# Patient Record
Sex: Male | Born: 1970
Health system: Southern US, Community
[De-identification: ages and names within clinical notes are randomized; demographics above are authoritative.]

## PROBLEM LIST (undated history)

## (undated) ENCOUNTER — Ambulatory Visit: Admission: EM | Payer: Commercial Managed Care - PPO

## (undated) DIAGNOSIS — E785 Hyperlipidemia, unspecified: Secondary | ICD-10-CM

## (undated) HISTORY — PX: ANKLE SURGERY: SHX546

## (undated) HISTORY — PX: LAMINECTOMY: SHX219

---

## 2002-10-13 ENCOUNTER — Encounter: Admission: RE | Admit: 2002-10-13 | Discharge: 2002-10-28 | Payer: Self-pay | Admitting: Neurosurgery

## 2003-05-09 ENCOUNTER — Emergency Department (HOSPITAL_COMMUNITY): Admission: EM | Admit: 2003-05-09 | Discharge: 2003-05-09 | Payer: Self-pay | Admitting: Emergency Medicine

## 2003-05-12 ENCOUNTER — Ambulatory Visit (HOSPITAL_COMMUNITY): Admission: RE | Admit: 2003-05-12 | Discharge: 2003-05-13 | Payer: Self-pay | Admitting: Neurosurgery

## 2009-08-04 ENCOUNTER — Ambulatory Visit: Payer: Self-pay | Admitting: Occupational Medicine

## 2009-08-04 DIAGNOSIS — H6690 Otitis media, unspecified, unspecified ear: Secondary | ICD-10-CM | POA: Insufficient documentation

## 2009-08-04 DIAGNOSIS — J019 Acute sinusitis, unspecified: Secondary | ICD-10-CM | POA: Insufficient documentation

## 2009-11-02 ENCOUNTER — Ambulatory Visit: Payer: Self-pay | Admitting: Family Medicine

## 2009-11-02 DIAGNOSIS — R51 Headache: Secondary | ICD-10-CM | POA: Insufficient documentation

## 2009-11-02 DIAGNOSIS — E785 Hyperlipidemia, unspecified: Secondary | ICD-10-CM | POA: Insufficient documentation

## 2009-11-02 DIAGNOSIS — R509 Fever, unspecified: Secondary | ICD-10-CM | POA: Insufficient documentation

## 2009-11-02 DIAGNOSIS — R519 Headache, unspecified: Secondary | ICD-10-CM | POA: Insufficient documentation

## 2009-11-03 ENCOUNTER — Encounter: Payer: Self-pay | Admitting: Family Medicine

## 2009-11-03 LAB — CONVERTED CEMR LAB
ALT: 57 units/L — ABNORMAL HIGH (ref 0–53)
AST: 30 units/L (ref 0–37)
Albumin: 4.5 g/dL (ref 3.5–5.2)
Alkaline Phosphatase: 55 units/L (ref 39–117)
BUN: 19 mg/dL (ref 6–23)
CO2: 19 meq/L (ref 19–32)
Calcium: 9.1 mg/dL (ref 8.4–10.5)
Chloride: 103 meq/L (ref 96–112)
Creatinine, Ser: 0.94 mg/dL (ref 0.40–1.50)
Glucose, Bld: 94 mg/dL (ref 70–99)
Potassium: 4 meq/L (ref 3.5–5.3)
Sodium: 140 meq/L (ref 135–145)
Total Bilirubin: 0.9 mg/dL (ref 0.3–1.2)
Total Protein: 7.2 g/dL (ref 6.0–8.3)

## 2010-04-11 NOTE — Letter (Signed)
Summary: Out of Work  [facility]  [address]   [place], [place] [address]   Phone: [phone]  Fax: [phone]    November 02, 2009   Employee:  [name] Roker    To Whom It May Concern:   For Medical reasons, please excuse the above named employee from work today and tomorrow.  If you need additional information, please feel free to contact our office.         Sincerely,    Donna Christen MD  Appended Document: Out of Work Urinalysis (dipstick) negative.

## 2010-04-11 NOTE — Assessment & Plan Note (Signed)
Summary: EXTREME HEADACHE AND FEVER (rm 5)   Vital Signs:  Patient Profile:   40 Years Old Male CC:      HA, fever, nausea x 2 days Height:     76 inches O2 Sat:      99 % O2 treatment:    Room Air Temp:     98.7 degrees F oral Pulse rate:   70 / minute Resp:     12 per minute BP sitting:   96 / 66  (left arm) Cuff size:   large  Pt. in pain?   yes    Location:   head    Intensity:   7    Type:       aching  Vitals Entered By: Lajean Saver RN (November 02, 2009 9:48 AM)                   Updated Prior Medication List: ZYRTEC ALLERGY 10 MG CAPS (CETIRIZINE HCL) once daily SUDAFED 12 HOUR 120 MG XR12H-TAB (PSEUDOEPHEDRINE HCL)   Current Allergies (reviewed today): ! * SEASONALHistory of Present Illness Chief Complaint: HA, fever, nausea x 2 days History of Present Illness:  Subjective:  Patient complains of onset of nausea without vomiting five days ago, associated with fever as high as 101. No abdominal pain, GU symptoms, or respiratory symptoms.  Over the past two days he has developed increasing intermittent throbbing occipital headache radiating to the frontal area.  He rarely has headaches but had a similar one in 2005 when he had lumbar disc surgery. He has had no localizing neuro symptoms.  No known tick exposure.  No rash.  He denies arthralgias, although he has had some myalgias.  He has a family history of migraines. He states that his young son developed nausea and fever about 9 to 10 days ago that lasted about two days, and his daughter developed similar symptoms about 5 or 6 days.    REVIEW OF SYSTEMS Constitutional Symptoms       Complains of fever and fatigue.     Denies chills, night sweats, weight loss, and weight gain.      Comments: body aches, jaw and neck soreness Eyes       Denies change in vision, eye pain, eye discharge, glasses, contact lenses, and eye surgery. Ear/Nose/Throat/Mouth       Denies hearing loss/aids, change in hearing, ear pain, ear  discharge, dizziness, frequent runny nose, frequent nose bleeds, sinus problems, sore throat, hoarseness, and tooth pain or bleeding.  Respiratory       Denies dry cough, productive cough, wheezing, shortness of breath, asthma, bronchitis, and emphysema/COPD.  Cardiovascular       Denies murmurs, chest pain, and tires easily with exhertion.    Gastrointestinal       Complains of nausea/vomiting.      Denies stomach pain, diarrhea, constipation, blood in bowel movements, and indigestion.      Comments: no vomiting Genitourniary       Denies painful urination, kidney stones, and loss of urinary control. Neurological       Complains of headaches.      Denies paralysis, seizures, and fainting/blackouts. Musculoskeletal       Complains of joint stiffness.      Denies muscle pain, joint pain, decreased range of motion, redness, swelling, muscle weakness, and gout.  Skin       Denies bruising, unusual mles/lumps or sores, and hair/skin or nail changes.  Psych  Denies mood changes, temper/anger issues, anxiety/stress, speech problems, depression, and sleep problems. Other Comments: Ibuprofen taken this AM. Patient's kids have recently been ill with the same symptoms.    Past History:  Past Medical History:  Hyperlipidemia- diet controlled  Past Surgical History: Reviewed history from 08/04/2009 and no changes required. L5 Laminectomy 05/2003  Family History: Reviewed history from 08/04/2009 and no changes required. none  Social History: Reviewed history from 08/04/2009 and no changes required. Occupation: Science writer Married Never Smoked Alcohol use-no Drug use-no Regular exercise-no   Objective:  Appearance:  Patient appears healthy, stated age, and in no acute distress. Eyes:  Pupils are equal, round, and reactive to light and accomdation.  Extraocular movement is intact.  Conjunctivae are not inflamed.  Ears:  Canals normal.  Tympanic membranes normal.   Nose:   Normal septum.  Normal turbinates, mildly congested.   No sinus tenderness present.  Pharynx:  Normal, moist mucous membranes  Neck:  Supple.  No adenopathy is present.  No thyromegaly is present  Lungs:  Clear to auscultation.  Breath sounds are equal.  Heart:  Regular rate and rhythm without murmurs, rubs, or gallops.  Abdomen:  Nontender without masses or hepatosplenomegaly.  Bowel sounds are present.  No CVA or flank tenderness.  Skin:  No rash Neurologic:  Cranial nerves 2 through 12 are normal.  Patellar and elbow reflexes are normal.  Cerebellar function is intact.  Gait and station are normal.   CBC:  WBC 6.8, Hgb 14.3 Assessment New Problems: FEVER UNSPECIFIED (ICD-780.60) HEADACHE (ICD-784.0) HYPERLIPIDEMIA (ICD-272.4)  NORMAL EXAM TODAY.  SUSPECT VIRAL ILLNESS (GI)  Plan New Medications/Changes: FIORICET 50-325-40 MG TABS (BUTALBITAL-APAP-CAFFEINE) One or two by mouth q4 to 6hr as needed headache.  Max of 6/day  #12 (twelve) x 0, 11/02/2009, Donna Christen MD PROMETHAZINE HCL 25 MG TABS (PROMETHAZINE HCL) 1 by mouth q4 to 6hr as needed nausea  #12 x 0, 11/02/2009, Donna Christen MD  New Orders: CBC w/Diff [[id number]] T-Comprehensive Metabolic Panel [[id number]] Urinalysis [81003-65000] T- * Misc. Laboratory test [phone] T- * Misc. Laboratory test [99999] Est. Patient Level IV [99214] Ketorolac-Toradol 15mg  [J1885] Promethazine up to 50mg  [J2550] Admin of Therapeutic Inj  intramuscular or subcutaneous [96372] Planning Comments:   Toradol 60mg  IM and Phenergan 25mg  IM. Clear liquids today and slowly advance.  Rx for oral phenergan and Fioricet for headache Will check RMSF and Lyme's disease titers.  Also CMP and U/A. Return for worsening symptoms.  Follow-up with PCP if not improving.   The patient and/or caregiver has been counseled thoroughly with regard to medications prescribed including dosage, schedule, interactions, rationale for use, and possible side effects  and they verbalize understanding.  Diagnoses and expected course of recovery discussed and will return if not improved as expected or if the condition worsens. Patient and/or caregiver verbalized understanding.  Prescriptions: FIORICET 50-325-40 MG TABS (BUTALBITAL-APAP-CAFFEINE) One or two by mouth q4 to 6hr as needed headache.  Max of 6/day  #12 (twelve) x 0   Entered and Authorized by:   Donna Christen MD   Signed by:   Donna Christen MD on 11/02/2009   Method used:   Print then Give to Patient   RxID:   [id number] PROMETHAZINE HCL 25 MG TABS (PROMETHAZINE HCL) 1 by mouth q4 to 6hr as needed nausea  #12 x 0   Entered and Authorized by:   Donna Christen MD   Signed by:   Donna Christen MD on 11/02/2009   Method used:  Print then Give to Patient   RxID:   [id number]   Medication Administration  Injection # 1:    Medication: Ketorolac-Toradol 15mg     Diagnosis: HEADACHE (ICD-784.0)    Route: IM    Site: LUOQ gluteus    Exp Date: 11/11/2010    Lot #: [id number]    Mfr: [employer]    Comments: 60mg  given    Patient tolerated injection without complications    Given by: Lajean Saver RN (November 02, 2009 11:37 AM)  Injection # 2:    Medication: Promethazine up to 50mg     Diagnosis: HEADACHE (ICD-784.0)    Route: IM    Site: RUOQ gluteus    Exp Date: 04/11/2010    Lot #: [id number]    Mfr: novaplus    Comments: 25mg  given  Orders Added: 1)  CBC w/Diff [[id number]] 2)  T-Comprehensive Metabolic Panel [[id number]] 3)  Urinalysis [81003-65000] 4)  T- * Misc. Laboratory test [99999] 5)  T- * Misc. Laboratory test [99999] 6)  Est. Patient Level IV [95284] 7)  Ketorolac-Toradol 15mg  [J1885] 8)  Promethazine up to 50mg  [J2550] 9)  Admin of Therapeutic Inj  intramuscular or subcutaneous [96372]  Appended Document: EXTREME HEADACHE AND FEVER (rm 5) UA: Glu: Negative BIL: Negative KET: Trace SG: 1.015 Blood: Negative pH: 6.5 PRO: Negative URO: 0.2 NIT:  Negative LEU: Negative

## 2010-04-11 NOTE — Assessment & Plan Note (Signed)
Summary: SINUS/KH   Vital Signs:  Patient Profile:   40 Years Old Male CC:      bilateral ear pressure, sinus congestion/drainage  Height:     76 inches Weight:      219 pounds O2 Sat:      99 % O2 treatment:    Room Air Temp:     96.9 degrees F oral Pulse rate:   89 / minute Pulse rhythm:   regular Resp:     12 per minute BP sitting:   123 / 74  (right arm) Cuff size:   large  Pt. in pain?   yes    Location:   sinus    Type:       pressure  Vitals Entered By: Lajean Saver RN (Aug 04, 2009 12:08 PM)                   Updated Prior Medication List: ZYRTEC ALLERGY 10 MG CAPS (CETIRIZINE HCL) once daily SUDAFED 12 HOUR 120 MG XR12H-TAB (PSEUDOEPHEDRINE HCL)   Current Allergies: ! * SEASONALHistory of Present Illness Chief Complaint: bilateral ear pressure, sinus congestion/drainage  History of Present Illness: Pleasant gentleman presents with a 2 week history of sinus congestion.   Lost of post nasal drip that keeps him up at night.   Ears have been hurting for about a day.  Throat is a little sore.   Minor cough.   Has been taking zyrtec and pseudofed with some relief.  He has been out of rhinocort for about 6 months.   Has appt with PCP next couple of weeks.   REVIEW OF SYSTEMS Constitutional Symptoms      Denies fever, chills, night sweats, weight loss, weight gain, and fatigue.  Eyes       Denies change in vision, eye pain, eye discharge, glasses, contact lenses, and eye surgery. Ear/Nose/Throat/Mouth       Complains of ear pain, frequent runny nose, and sinus problems.      Denies hearing loss/aids, change in hearing, ear discharge, dizziness, frequent nose bleeds, sore throat, hoarseness, and tooth pain or bleeding.      Comments: bilateral ear pressure Respiratory       Denies dry cough, productive cough, wheezing, shortness of breath, asthma, bronchitis, and emphysema/COPD.  Cardiovascular       Denies murmurs, chest pain, and tires easily with exhertion.     Gastrointestinal       Denies stomach pain, nausea/vomiting, diarrhea, constipation, blood in bowel movements, and indigestion. Genitourniary       Denies painful urination, kidney stones, and loss of urinary control. Neurological       Denies paralysis, seizures, and fainting/blackouts. Musculoskeletal       Denies muscle pain, joint pain, joint stiffness, decreased range of motion, redness, swelling, muscle weakness, and gout.  Skin       Denies bruising, unusual mles/lumps or sores, and hair/skin or nail changes.  Psych       Denies mood changes, temper/anger issues, anxiety/stress, speech problems, depression, and sleep problems. 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 Alcohol use-no Drug use-no Regular exercise-no Smoking Status:  never Drug Use:  no Does Patient Exercise:  no Physical Exam General appearance: well developed, well nourished, no acute distress Ears: inflamed left TM Nasal: swollen  red turbinates with congestion Oral/Pharynx: tongue normal, posterior pharynx without erythema or exudate Neck: neck supple,  trachea midline, no masses Chest/Lungs: no rales, wheezes, or rhonchi bilateral, breath sounds equal without effort Heart: regular rate and  rhythm, no murmur Assessment Problems:   UNSPECIFIED OTITIS MEDIA (ICD-382.9) ACUTE SINUSITIS, UNSPECIFIED (ICD-461.9) New Problems: UNSPECIFIED OTITIS MEDIA (ICD-382.9) ACUTE SINUSITIS, UNSPECIFIED (ICD-461.9)   Plan New Medications/Changes: AZITHROMYCIN 250 MG  TABS (AZITHROMYCIN) 2 by  mouth today and then 1 daily for 4 days  #6 x 0, 08/04/2009, Kathrine Haddock MD RHINOCORT AQUA 32 MCG/ACT SUSP (BUDESONIDE) 2 sprays in each nostril once a day  #1 x 1, 08/04/2009,  Kathrine Haddock MD  New Orders: Est. Patient Level II [phone] Planning Comments:   Refill Rhinocort Z-pack as directed Continue zyrtec and pseudofed follow up wtih PCP at next scheduled appt.   The patient and/or caregiver has been counseled thoroughly with regard to medications prescribed including dosage, schedule, interactions, rationale for use, and possible side effects and they verbalize understanding.  Diagnoses and expected course of recovery discussed and will return if not improved as expected or if the condition worsens. Patient and/or caregiver verbalized understanding.  Prescriptions: AZITHROMYCIN 250 MG  TABS (AZITHROMYCIN) 2 by  mouth today and then 1 daily for 4 days  #6 x 0   Entered and Authorized by:   Kathrine Haddock MD   Signed by:   Kathrine Haddock MD on 08/04/2009   Method used:   Electronically to        [facility]* (retail)       [address]       [address] [place] [address]       [place], [place]  [address]       Ph: [phone]       Fax: [phone]   RxID:   [id number] RHINOCORT AQUA 32 MCG/ACT SUSP (BUDESONIDE) 2 sprays in each nostril once a day  #1 x 1   Entered and Authorized by:   Kathrine Haddock MD   Signed by:   Kathrine Haddock MD on 08/04/2009   Method used:   Electronically to        [facility]* (retail)       [address]       [address] [place] [address]       [place], [place]  [address]       Ph: [phone]       Fax: [phone]   RxID:   [id number]

## 2010-07-28 NOTE — Op Note (Signed)
Joshua Randolph, Joshua Randolph                          ACCOUNT NO.:  [id number]   MEDICAL RECORD NO.:  [id number]                   PATIENT TYPE:  OIB   LOCATION:  3013                                 FACILITY:  [facility]   PHYSICIAN:  Coletta Memos, M.D.                  DATE OF BIRTH:  10-03-1970   DATE OF PROCEDURE:  05/12/2003  DATE OF DISCHARGE:  05/13/2003                                 OPERATIVE REPORT   PREOPERATIVE DIAGNOSIS:  1. Displaced disc right L4-L5.  2. Right L5 radiculopathy.   POSTOPERATIVE DIAGNOSIS:  1. Displaced disc right L4-L5.  2. Right L5 radiculopathy.   PROCEDURE:  Right L4-L5 semi-hemilaminectomy and discectomy with  microdissection.   COMPLICATIONS:  None.   SURGEON:  Coletta Memos, M.D.   ANESTHESIA:  General endotracheal anesthesia.   INDICATIONS FOR PROCEDURE:  Joshua Randolph is a 40 year old whom I have  followed since the summer of 2004.  He had a very small disc at L4-L5 at  that time and some pain which improved with conservative care.  He, however,  called the office this past Monday and said he was in severe pain.  A new  MRI done Tuesday night showed a very large herniated disc at L4-L5.  I,  therefore, recommended and he agreed to undergo operative decompression.   OPERATIVE NOTE:  [name] was brought to the operating room, intubated, and  placed under general anesthesia without difficulty.  He was rolled prone  onto a Wilson frame and all pressure points were properly padded.  His skin  was prepped and he was draped in a sterile fashion.  Using a preoperative  localizing film, I infiltrated 0.5% lidocaine with 1:200,000 epinephrine  epinephrine into the operative field.  I opened the skin with a #10 blade  and took this down to the thoracolumbar fascia sharply.  Then, using  monopolar cautery in a subperiosteal I exposed the lamina of what I believed  to be L4 and L5.  X-rays showed I was in the correct interlaminar space.  Using a microscope  to aid in microdissection, I completed semi-  hemilaminectomy at L4 on the right side removing the ligamentum flavum and  exposing the thecal sac.  After identifying the L5 nerve root, I retracted  that medially and then opened the disc space with a #15 blade after  cauterizing epidural veins.  I removed the disc in a progressive fashion,  decompressing the L5 nerve root in a medial fashion.  I also removed a  significant amount of degenerated disc from the disc space itself.  When I  was satisfied that I had fully decompressed the nerve root after inspecting  the medial, lateral, rostral and caudal direction, I irrigated the wound.  I  then closed the wound in a layered fashion.  The wound dressing was  Dermabond.  The patient tolerated the procedure well, moving all extremities  postop.                                               Coletta Memos, M.D.   KC/MEDQ  D:  05/13/2003  T:  05/13/2003  Job:  [id number]

## 2011-04-27 ENCOUNTER — Emergency Department (INDEPENDENT_AMBULATORY_CARE_PROVIDER_SITE_OTHER)
Admission: EM | Admit: 2011-04-27 | Discharge: 2011-04-27 | Disposition: A | Discharge status: Home / Self Care | Payer: BC Managed Care – PPO | Source: Home / Self Care | Attending: Family Medicine | Admitting: Family Medicine

## 2011-04-27 ENCOUNTER — Encounter: Payer: Self-pay | Admitting: *Deleted

## 2011-04-27 DIAGNOSIS — R0789 Other chest pain: Secondary | ICD-10-CM

## 2011-04-27 HISTORY — DX: Hyperlipidemia, unspecified: E78.5

## 2011-04-27 NOTE — ED Notes (Signed)
Pt c/o LT sided chest tightness and fatigue x today. Denies SOB, denies nausea, and denies LT arm pain.

## 2011-04-28 NOTE — Discharge Instructions (Signed)
If symptoms become significantly worse during the night or over the weekend, proceed to the local emergency room.  

## 2011-04-28 NOTE — ED Provider Notes (Signed)
History     CSN: [id number]  Arrival date & time 04/27/11  1607   First MD Initiated Contact with Patient 04/27/11 1618      Chief Complaint  Patient presents with  . Chest Pain     HPI Comments: Patient complains of onset of vague mild non-radiating left anterior chest discomfort this morning.  The discomfort resolved spontaneously and has not recurred.  No shortness of breath or sweating.  He recalls no recent injury or change in physical activities.  No rash  Patient is a 41 y.o. male presenting with chest pain. The history is provided by the patient.  Chest Pain The chest pain began 1 - 2 hours ago. Duration of episode(s) is 1 hour. The chest pain is resolved. Associated with: nothin. The quality of the pain is described as aching and pressure-like. The pain does not radiate. Exacerbated by: nothing. Pertinent negatives for primary symptoms include no fever, no fatigue, no shortness of breath, no cough, no wheezing, no palpitations, no abdominal pain, no nausea, no vomiting and no dizziness.  Pertinent negatives for associated symptoms include no diaphoresis and no lower extremity edema. He tried nothing for the symptoms. Risk factors: hyperlipidemia.     Past Medical History  Diagnosis Date  . Hyperlipemia     Past Surgical History  Procedure Date  . Ankle surgery   . Laminectomy     History reviewed. No pertinent family history.  History  Substance Use Topics  . Smoking status: Never Smoker   . Smokeless tobacco: Not on file  . Alcohol Use: Yes     rarely      Review of Systems  Constitutional: Negative for fever, diaphoresis and fatigue.  Respiratory: Negative for cough, shortness of breath and wheezing.   Cardiovascular: Positive for chest pain. Negative for palpitations.  Gastrointestinal: Negative for nausea, vomiting and abdominal pain.  Neurological: Negative for dizziness.  All other systems reviewed and are negative.    Allergies  Review of  patient's allergies indicates no known allergies.  Home Medications   Current Outpatient Rx  Name Route Sig Dispense Refill  . ATORVASTATIN CALCIUM 10 MG PO TABS Oral Take 10 mg by mouth daily.      BP 115/77  Pulse 73  Temp(Src) 98.2 F (36.8 C) (Oral)  Resp 16  Ht 6\' 4"  (1.93 m)  Wt 225 lb 8 oz (102.286 kg)  BMI 27.45 kg/m2  SpO2 99%  Physical Exam Nursing notes and Vital Signs reviewed. Appearance:  Patient appears healthy, stated age, and in no acute distress.  He is alert and oriented  Eyes:  Pupils are equal, round, and reactive to light and accomodation.  Extraocular movement is intact.  Conjunctivae are not inflamed  Ears:  Canals normal.  Tympanic membranes normal.  Nose:   No sinus tenderness.  Pharynx:  Normal Neck:  Supple.  No adenopathy Lungs:  Clear to auscultation.  Breath sounds are equal.  Heart:  Regular rate and rhythm without murmurs, rubs, or gallops.  Chest:  Mild tenderness over mid-sternum.  No tenderness over left pectoralis muscle. Abdomen:  Nontender without masses or hepatosplenomegaly.  Bowel sounds are present.  No CVA or flank tenderness.  Extremities:  No edema.  No calf tenderness Skin:  No rash present.   ED Course  Procedures none   EKG:  Within normal limits.  No acute changes   1. Musculoskeletal chest pain       MDM  Recommend follow-up with PCP if  symptoms persist. If symptoms become significantly worse during the night or over the weekend, proceed to the local emergency room.         Donna Christen, MD 04/28/11 1911

## 2012-03-12 DIAGNOSIS — H8101 Meniere's disease, right ear: Secondary | ICD-10-CM

## 2012-03-12 HISTORY — DX: Meniere's disease, right ear: H81.01

## 2015-07-15 DIAGNOSIS — M62830 Muscle spasm of back: Secondary | ICD-10-CM | POA: Diagnosis not present

## 2015-07-15 DIAGNOSIS — M5413 Radiculopathy, cervicothoracic region: Secondary | ICD-10-CM | POA: Diagnosis not present

## 2015-07-15 DIAGNOSIS — M542 Cervicalgia: Secondary | ICD-10-CM | POA: Diagnosis not present

## 2015-07-15 DIAGNOSIS — M546 Pain in thoracic spine: Secondary | ICD-10-CM | POA: Diagnosis not present

## 2015-09-03 ENCOUNTER — Emergency Department
Admission: EM | Admit: 2015-09-03 | Discharge: 2015-09-03 | Disposition: A | Discharge status: Home / Self Care | Payer: 59 | Source: Home / Self Care | Attending: Family Medicine | Admitting: Family Medicine

## 2015-09-03 ENCOUNTER — Encounter: Payer: Self-pay | Admitting: Emergency Medicine

## 2015-09-03 DIAGNOSIS — R300 Dysuria: Secondary | ICD-10-CM | POA: Diagnosis not present

## 2015-09-03 LAB — POCT URINALYSIS DIP (MANUAL ENTRY)
Bilirubin, UA: NEGATIVE
Blood, UA: NEGATIVE
Glucose, UA: NEGATIVE
Ketones, POC UA: NEGATIVE
Leukocytes, UA: NEGATIVE
Nitrite, UA: NEGATIVE
Protein Ur, POC: NEGATIVE
Spec Grav, UA: 1.015 (ref 1.005–1.03)
Urobilinogen, UA: 0.2 (ref 0–1)
pH, UA: 7 (ref 5–8)

## 2015-09-03 MED ORDER — CIPROFLOXACIN HCL 500 MG PO TABS: 500.0000 mg | ORAL_TABLET | Freq: Two times a day (BID) | ORAL | Status: DC

## 2015-09-03 NOTE — ED Provider Notes (Signed)
CSN: [id number]     Arrival date & time 09/03/15  [id number] History   First MD Initiated Contact with Patient 09/03/15 [phone]     Chief Complaint  Patient presents with  . Dysuria  . Urinary Frequency      HPI Comments: Patient noticed mild urinary frequency about two days ago, and yesterday developed mild dysuria and urgency.  No nocturia.  He feels well otherwise.  No testicular pain/swelling.  No urethral discharge. No fevers, chills, and sweats   Patient is a 45 y.o. male presenting with dysuria. The history is provided by the patient.  Dysuria This is a new problem. The current episode started yesterday. The problem occurs constantly. The problem has not changed since onset.Pertinent negatives include no abdominal pain. Nothing aggravates the symptoms. Nothing relieves the symptoms. He has tried nothing for the symptoms.    Past Medical History  Diagnosis Date  . Hyperlipemia    Past Surgical History  Procedure Laterality Date  . Ankle surgery    . Laminectomy     History reviewed. No pertinent family history. Social History  Substance Use Topics  . Smoking status: Never Smoker   . Smokeless tobacco: None  . Alcohol Use: Yes     Comment: rarely    Review of Systems  Gastrointestinal: Negative for abdominal pain.  Genitourinary: Positive for dysuria.  All other systems reviewed and are negative.   Allergies  Review of patient's allergies indicates no known allergies.  Home Medications   Prior to Admission medications   Medication Sig Start Date End Date Taking? Authorizing Provider  atorvastatin (LIPITOR) 10 MG tablet Take 10 mg by mouth daily.    Historical Provider, MD  ciprofloxacin (CIPRO) 500 MG tablet Take 1 tablet (500 mg total) by mouth 2 (two) times daily. 09/03/15   Lattie HawStephen A Beese, MD   Meds Ordered and Administered this Visit  Medications - No data to display  BP 107/62 mmHg  Pulse 66  Temp(Src) 98 F (36.7 C) (Oral)  Resp 16  Ht 6\' 4"  (1.93 m)  Wt  186 lb (84.369 kg)  BMI 22.65 kg/m2  SpO2 97% No data found.   Physical Exam Nursing notes and Vital Signs reviewed. Appearance:  Patient appears stated age, and in no acute distress Eyes:  Pupils are equal, round, and reactive to light and accomodation.  Extraocular movement is intact.  Conjunctivae are not inflamed  Pharynx:  Normal Neck:  Supple.  No adenopathy Lungs:  Clear to auscultation.  Breath sounds are equal.  Moving air well. Heart:  Regular rate and rhythm without murmurs, rubs, or gallops.  Abdomen:  Nontender without masses or hepatosplenomegaly.  Bowel sounds are present.  No CVA or flank tenderness.  Extremities:  No edema.  Skin:  No rash present.   ED Course  Procedures none    Labs Reviewed  URINE CULTURE  POCT URINALYSIS DIP (MANUAL ENTRY) negative      MDM   1. Dysuria    Urine culture pending.  Begin empiric Cipro 500mg  BID #14 Increase fluid intake. Followup with Family Doctor if not improved in one week.     Lattie HawStephen A Beese, MD 09/03/15 1020

## 2015-09-03 NOTE — ED Notes (Signed)
Patient states yesterday began having urgency and dysuria.

## 2015-09-03 NOTE — Discharge Instructions (Signed)
Increase fluid intake.   Dysuria Dysuria is pain or discomfort while urinating. The pain or discomfort may be felt in the tube that carries urine out of the bladder (urethra) or in the surrounding tissue of the genitals. The pain may also be felt in the groin area, lower abdomen, and lower back. You may have to urinate frequently or have the sudden feeling that you have to urinate (urgency). Dysuria can affect both men and women, but is more common in women. Dysuria can be caused by many different things, including:  Urinary tract infection in women.  Infection of the kidney or bladder.  Kidney stones or bladder stones.  Certain sexually transmitted infections (STIs), such as chlamydia.  Dehydration.  Inflammation of the vagina.  Use of certain medicines.  Use of certain soaps or scented products that cause irritation. HOME CARE INSTRUCTIONS Watch your dysuria for any changes. The following actions may help to reduce any discomfort you are feeling:  Drink enough fluid to keep your urine clear or pale yellow.  Empty your bladder often. Avoid holding urine for long periods of time.  After a bowel movement or urination, women should cleanse from front to back, using each tissue only once.  Empty your bladder after sexual intercourse.  Take medicines only as directed by your health care provider.  If you were prescribed an antibiotic medicine, finish it all even if you start to feel better.  Avoid caffeine, tea, and alcohol. They can irritate the bladder and make dysuria worse. In men, alcohol may irritate the prostate.  Keep all follow-up visits as directed by your health care provider. This is important.  If you had any tests done to find the cause of dysuria, it is your responsibility to obtain your test results. Ask the lab or department performing the test when and how you will get your results. Talk with your health care provider if you have any questions about your  results. SEEK MEDICAL CARE IF:  You develop pain in your back or sides.  You have a fever.  You have nausea or vomiting.  You have blood in your urine.  You are not urinating as often as you usually do. SEEK IMMEDIATE MEDICAL CARE IF:  You pain is severe and not relieved with medicines.  You are unable to hold down any fluids.  You or someone else notices a change in your mental function.  You have a rapid heartbeat at rest.  You have shaking or chills.  You feel extremely weak.   This information is not intended to replace advice given to you by your health care provider. Make sure you discuss any questions you have with your health care provider.   Document Released: 11/25/2003 Document Revised: 03/19/2014 Document Reviewed: 10/22/2013 Elsevier Interactive Patient Education [employer]2016 Elsevier Inc.

## 2015-09-04 LAB — URINE CULTURE
Colony Count: NO GROWTH
Organism ID, Bacteria: NO GROWTH

## 2015-09-05 ENCOUNTER — Telehealth: Payer: Self-pay | Admitting: *Deleted

## 2015-09-05 NOTE — ED Notes (Unsigned)
LM with Ucx results and to call back if he has any questions or concerns. Clemens Catholichristy Kalan Yeley, LPN

## 2015-09-07 ENCOUNTER — Emergency Department (HOSPITAL_BASED_OUTPATIENT_CLINIC_OR_DEPARTMENT_OTHER)
Admission: EM | Admit: 2015-09-07 | Discharge: 2015-09-07 | Disposition: A | Discharge status: Home / Self Care | Payer: 59 | Attending: Emergency Medicine | Admitting: Emergency Medicine

## 2015-09-07 ENCOUNTER — Emergency Department (HOSPITAL_BASED_OUTPATIENT_CLINIC_OR_DEPARTMENT_OTHER): Payer: 59

## 2015-09-07 ENCOUNTER — Encounter (HOSPITAL_BASED_OUTPATIENT_CLINIC_OR_DEPARTMENT_OTHER): Payer: Self-pay | Admitting: Emergency Medicine

## 2015-09-07 DIAGNOSIS — N200 Calculus of kidney: Secondary | ICD-10-CM | POA: Insufficient documentation

## 2015-09-07 DIAGNOSIS — R109 Unspecified abdominal pain: Secondary | ICD-10-CM | POA: Diagnosis present

## 2015-09-07 DIAGNOSIS — N1330 Unspecified hydronephrosis: Secondary | ICD-10-CM | POA: Diagnosis not present

## 2015-09-07 DIAGNOSIS — E785 Hyperlipidemia, unspecified: Secondary | ICD-10-CM | POA: Insufficient documentation

## 2015-09-07 LAB — CBC WITH DIFFERENTIAL/PLATELET
Basophils Absolute: 0.1 10*3/uL (ref 0.0–0.1)
Basophils Relative: 1 %
Eosinophils Absolute: 0.1 10*3/uL (ref 0.0–0.7)
Eosinophils Relative: 1 %
HCT: 43.5 % (ref 39.0–52.0)
Hemoglobin: 15.1 g/dL (ref 13.0–17.0)
Lymphocytes Relative: 13 %
Lymphs Abs: 1.4 10*3/uL (ref 0.7–4.0)
MCH: 31.9 pg (ref 26.0–34.0)
MCHC: 34.7 g/dL (ref 30.0–36.0)
MCV: 92 fL (ref 78.0–100.0)
Monocytes Absolute: 0.9 10*3/uL (ref 0.1–1.0)
Monocytes Relative: 8 %
Neutro Abs: 8.6 10*3/uL — ABNORMAL HIGH (ref 1.7–7.7)
Neutrophils Relative %: 77 %
Platelets: 217 10*3/uL (ref 150–400)
RBC: 4.73 MIL/uL (ref 4.22–5.81)
RDW: 12.6 % (ref 11.5–15.5)
WBC: 11 10*3/uL — ABNORMAL HIGH (ref 4.0–10.5)

## 2015-09-07 LAB — URINALYSIS, ROUTINE W REFLEX MICROSCOPIC
Bilirubin Urine: NEGATIVE
Glucose, UA: NEGATIVE mg/dL
Ketones, ur: NEGATIVE mg/dL
Leukocytes, UA: NEGATIVE
Nitrite: NEGATIVE
Protein, ur: >300 mg/dL — AB
Specific Gravity, Urine: 1.024 (ref 1.005–1.030)
pH: 6 (ref 5.0–8.0)

## 2015-09-07 LAB — BASIC METABOLIC PANEL
Anion gap: 8 (ref 5–15)
BUN: 24 mg/dL — ABNORMAL HIGH (ref 6–20)
CO2: 27 mmol/L (ref 22–32)
Calcium: 9.2 mg/dL (ref 8.9–10.3)
Chloride: 102 mmol/L (ref 101–111)
Creatinine, Ser: 1.22 mg/dL (ref 0.61–1.24)
GFR calc Af Amer: >60 mL/min (ref >60)
GFR calc non Af Amer: >60 mL/min (ref >60)
Glucose, Bld: 115 mg/dL — ABNORMAL HIGH (ref 65–99)
Potassium: 3.6 mmol/L (ref 3.5–5.1)
Sodium: 137 mmol/L (ref 135–145)

## 2015-09-07 LAB — URINE MICROSCOPIC-ADD ON: Bacteria, UA: NONE SEEN

## 2015-09-07 MED ORDER — ONDANSETRON HCL 4 MG/2ML IJ SOLN
4.0000 mg | Freq: Once | INTRAMUSCULAR | Status: AC
  Administered 2015-09-07: 4 mg via INTRAVENOUS
  Filled 2015-09-07: qty 2

## 2015-09-07 MED ORDER — FENTANYL CITRATE (PF) 100 MCG/2ML IJ SOLN
50.0000 ug | Freq: Once | INTRAMUSCULAR | Status: AC
  Administered 2015-09-07: 50 ug via INTRAVENOUS
  Filled 2015-09-07: qty 2

## 2015-09-07 MED ORDER — ONDANSETRON 4 MG PO TBDP: 4.0000 mg | ORAL_TABLET | Freq: Three times a day (TID) | ORAL | Status: DC | PRN

## 2015-09-07 MED ORDER — MORPHINE SULFATE (PF) 4 MG/ML IV SOLN
4.0000 mg | Freq: Once | INTRAVENOUS | Status: AC
  Administered 2015-09-07: 4 mg via INTRAVENOUS
  Filled 2015-09-07: qty 1

## 2015-09-07 MED ORDER — OXYCODONE-ACETAMINOPHEN 5-325 MG PO TABS: 1.0000 | ORAL_TABLET | Freq: Four times a day (QID) | ORAL | Status: DC | PRN

## 2015-09-07 MED ORDER — SODIUM CHLORIDE 0.9 % IV BOLUS (SEPSIS)
1000.0000 mL | Freq: Once | INTRAVENOUS | Status: AC
  Administered 2015-09-07: 1000 mL via INTRAVENOUS

## 2015-09-07 MED ORDER — IOPAMIDOL (ISOVUE-300) INJECTION 61%
100.0000 mL | Freq: Once | INTRAVENOUS | Status: AC | PRN
  Administered 2015-09-07: 100 mL via INTRAVENOUS

## 2015-09-07 MED ORDER — KETOROLAC TROMETHAMINE 30 MG/ML IJ SOLN
30.0000 mg | Freq: Once | INTRAMUSCULAR | Status: AC
  Administered 2015-09-07: 30 mg via INTRAVENOUS
  Filled 2015-09-07: qty 1

## 2015-09-07 MED FILL — OXYCODONE/APAP 5-325: 5-325 | 2 days supply | Qty: 10 | Fill #0

## 2015-09-07 NOTE — ED Provider Notes (Signed)
CSN: [id number]     Arrival date & time 09/07/15  0010 History   First MD Initiated Contact with Patient 09/07/15 0059     Chief Complaint  Patient presents with  . Abdominal Pain     (Consider location/radiation/quality/duration/timing/severity/associated sxs/prior Treatment) HPI  This is a 45 year old male with no significant past medical history who presents with abdominal pain and dysuria. Patient was seen and evaluated this weekend for the same. Reportedly had a negative urinalysis but was prescribed Cipro for his symptoms. Patient thought he was improving but the pain worsened tonight. He states the pain is periumbilical and in the right lower quadrant. He also has right flank pain. Pain comes and goes. It has been constant since 9 PM. He reports burning with urination. Denies hematuria. Pain is currently 7 out of 10. No fevers but does report night sweats on Saturday night. He reports nausea without vomiting.  Past Medical History  Diagnosis Date  . Hyperlipemia    Past Surgical History  Procedure Laterality Date  . Ankle surgery    . Laminectomy     No family history on file. Social History  Substance Use Topics  . Smoking status: Never Smoker   . Smokeless tobacco: None  . Alcohol Use: Yes     Comment: rarely    Review of Systems  Constitutional: Negative for fever.  Respiratory: Negative for shortness of breath.   Cardiovascular: Negative for chest pain.  Gastrointestinal: Positive for nausea and abdominal pain. Negative for vomiting and diarrhea.  Genitourinary: Positive for dysuria. Negative for flank pain.  Musculoskeletal: Negative for back pain.  All other systems reviewed and are negative.     Allergies  Review of patient's allergies indicates no known allergies.  Home Medications   Prior to Admission medications   Medication Sig Start Date End Date Taking? Authorizing Provider  atorvastatin (LIPITOR) 10 MG tablet Take 10 mg by mouth daily.     Historical Provider, MD  ciprofloxacin (CIPRO) 500 MG tablet Take 1 tablet (500 mg total) by mouth 2 (two) times daily. 09/03/15   Lattie HawStephen A Beese, MD   BP 122/66 mmHg  Pulse 64  Temp(Src) 97.9 F (36.6 C)  Resp 18  Ht 6\' 4"  (1.93 m)  Wt 187 lb (84.823 kg)  BMI 22.77 kg/m2  SpO2 98% Physical Exam  Constitutional: He is oriented to person, place, and time. He appears well-developed and well-nourished. No distress.  HENT:  Head: Normocephalic and atraumatic.  Cardiovascular: Normal rate, regular rhythm and normal heart sounds.   No murmur heard. Pulmonary/Chest: Effort normal and breath sounds normal. No respiratory distress. He has no wheezes.  Abdominal: Soft. Bowel sounds are normal. There is tenderness. There is no rebound.  Right lower quadrant tenderness palpation without rebound or guarding  Genitourinary:  No CVA tenderness  Musculoskeletal: He exhibits no edema.  Neurological: He is alert and oriented to person, place, and time.  Skin: Skin is warm and dry.  Psychiatric: He has a normal mood and affect.  Nursing note and vitals reviewed.   ED Course  Procedures (including critical care time) Labs Review Labs Reviewed  CBC WITH DIFFERENTIAL/PLATELET - Abnormal; Notable for the following:    WBC 11.0 (*)    Neutro Abs 8.6 (*)    All other components within normal limits  BASIC METABOLIC PANEL - Abnormal; Notable for the following:    Glucose, Bld 115 (*)    BUN 24 (*)    All other components within normal  limits  URINALYSIS, ROUTINE W REFLEX MICROSCOPIC (NOT AT [facility]) - Abnormal; Notable for the following:    Hgb urine dipstick SMALL (*)    Protein, ur >300 (*)    All other components within normal limits  URINE MICROSCOPIC-ADD ON - Abnormal; Notable for the following:    Squamous Epithelial / LPF 0-5 (*)    Casts HYALINE CASTS (*)    All other components within normal limits    Imaging Review Ct Abdomen Pelvis W Contrast  09/07/2015  CLINICAL DATA:   45 year old male with right lower quadrant abdominal pain EXAM: CT ABDOMEN AND PELVIS WITH CONTRAST TECHNIQUE: Multidetector CT imaging of the abdomen and pelvis was performed using the standard protocol following bolus administration of intravenous contrast. CONTRAST:  100mL ISOVUE-300 IOPAMIDOL (ISOVUE-300) INJECTION 61% COMPARISON:  CT dated 11/23/2011 FINDINGS: The visualized lung bases are clear. No intra-abdominal free air. Small free fluid within the pelvis. The liver, gallbladder, pancreas, spleen, adrenal glands, left kidney, left ureter appear unremarkable. There is a 2 mm calculus along the posterior wall of the urinary bladder adjacent to the right UVJ which may represent a recently passed right renal calculus versus right UVJ stone. There is mild right hydronephrosis. There is diffuse haziness of the bladder wall as well as haziness of the right perinephric fat. Correlation with urinalysis recommended to evaluate for superimposed UTI. The prostate and seminal vesicles are grossly unremarkable. There are scattered sigmoid diverticula without active inflammatory changes. There is moderate stool throughout the colon. Multiple normal caliber fecalized loops of small bowel noted suggestive of chronic stasis. There is no evidence of bowel obstruction or active inflammation. Normal appendix. The abdominal aorta and IVC appear unremarkable. No portal venous gas identified. There is no adenopathy. The abdominal wall soft tissues appear unremarkable. The osseous structures are intact. IMPRESSION: A 2 mm recently passed right renal calculus versus a right UPJ stone with mild right hydronephrosis. Correlation with urinalysis recommended to exclude superimposed UTI. Constipation with chronic fecal stasis within the small bowel. No bowel obstruction or active inflammation. Normal appendix. Electronically Signed   By: Elgie CollardArash  Radparvar M.D.   On: 09/07/2015 03:36   I have personally reviewed and evaluated these  images and lab results as part of my medical decision-making.   EKG Interpretation None      MDM   Final diagnoses:  Kidney stone   Patient presents with right flank, right lower quadrant pain and urinary symptoms. Currently on ciprofloxacin. Nontoxic-appearing. Does have some tenderness to palpation of the right lower quadrant. No signs of peritonitis. Considerations include appendicitis, pyelonephritis. Kidney stone is also consideration; however, would not expect tenderness on exam. Patient was given pain and nausea medication. He was also given fluids. Lab work obtained and notable for mild leukocytosis to 11. No leukocytes in the urine. CT obtained to evaluate for appendicitis, pyelonephritis, kidney stones. CT shows a 2 mm stone recently passed into the bladder. He has some hydronephrosis on that side. This is likely the cause of patient's symptoms. No evidence of UTI at this time but the patient is currently on Cipro. On recheck, he reports improvement of his pain. Patient to continue Cipro and will be provided with medications for supportive care. Urology follow-up.  After history, exam, and medical workup I feel the patient has been appropriately medically screened and is safe for discharge home. Pertinent diagnoses were discussed with the patient. Patient was given return precautions.     Shon Batonourtney F Horton, MD 09/07/15 0430

## 2015-09-07 NOTE — ED Notes (Signed)
C/o abd discomfort and burning w urination since sat,  Tonight abd pain is worse,  Was seen sat for same  Started on cipro but no improvement

## 2015-09-07 NOTE — ED Notes (Signed)
RLQ pain with slight pain in right flank.  [facility]  UA and culture were clear.  Is on day 4 of Cipro.  Pain much worse tonight.

## 2015-09-07 NOTE — Discharge Instructions (Signed)
You were found to have passed a kidney stone anterior bladder. Hopefully your pain will improve given that you pass the stone. If you have recurrent pain or any new or worsening symptoms she needs to follow-up with urology. Continue ciprofloxacin as previously directed.  Kidney Stones Kidney stones (urolithiasis) are deposits that form inside your kidneys. The intense pain is caused by the stone moving through the urinary tract. When the stone moves, the ureter goes into spasm around the stone. The stone is usually passed in the urine.  CAUSES   A disorder that makes certain neck glands produce too much parathyroid hormone (primary hyperparathyroidism).  A buildup of uric acid crystals, similar to gout in your joints.  Narrowing (stricture) of the ureter.  A kidney obstruction present at birth (congenital obstruction).  Previous surgery on the kidney or ureters.  Numerous kidney infections. SYMPTOMS   Feeling sick to your stomach (nauseous).  Throwing up (vomiting).  Blood in the urine (hematuria).  Pain that usually spreads (radiates) to the groin.  Frequency or urgency of urination. DIAGNOSIS   Taking a history and physical exam.  Blood or urine tests.  CT scan.  Occasionally, an examination of the inside of the urinary bladder (cystoscopy) is performed. TREATMENT   Observation.  Increasing your fluid intake.  Extracorporeal shock wave lithotripsy--This is a noninvasive procedure that uses shock waves to break up kidney stones.  Surgery may be needed if you have severe pain or persistent obstruction. There are various surgical procedures. Most of the procedures are performed with the use of small instruments. Only small incisions are needed to accommodate these instruments, so recovery time is minimized. The size, location, and chemical composition are all important variables that will determine the proper choice of action for you. Talk to your health care provider to  better understand your situation so that you will minimize the risk of injury to yourself and your kidney.  HOME CARE INSTRUCTIONS   Drink enough water and fluids to keep your urine clear or pale yellow. This will help you to pass the stone or stone fragments.  Strain all urine through the provided strainer. Keep all particulate matter and stones for your health care provider to see. The stone causing the pain may be as small as a grain of salt. It is very important to use the strainer each and every time you pass your urine. The collection of your stone will allow your health care provider to analyze it and verify that a stone has actually passed. The stone analysis will often identify what you can do to reduce the incidence of recurrences.  Only take over-the-counter or prescription medicines for pain, discomfort, or fever as directed by your health care provider.  Keep all follow-up visits as told by your health care provider. This is important.  Get follow-up X-rays if required. The absence of pain does not always mean that the stone has passed. It may have only stopped moving. If the urine remains completely obstructed, it can cause loss of kidney function or even complete destruction of the kidney. It is your responsibility to make sure X-rays and follow-ups are completed. Ultrasounds of the kidney can show blockages and the status of the kidney. Ultrasounds are not associated with any radiation and can be performed easily in a matter of minutes.  Make changes to your daily diet as told by your health care provider. You may be told to:  Limit the amount of salt that you eat.  Eat  5 or more servings of fruits and vegetables each day.  Limit the amount of meat, poultry, fish, and eggs that you eat.  Collect a 24-hour urine sample as told by your health care provider.You may need to collect another urine sample every 6-12 months. SEEK MEDICAL CARE IF:  You experience pain that is  progressive and unresponsive to any pain medicine you have been prescribed. SEEK IMMEDIATE MEDICAL CARE IF:   Pain cannot be controlled with the prescribed medicine.  You have a fever or shaking chills.  The severity or intensity of pain increases over 18 hours and is not relieved by pain medicine.  You develop a new onset of abdominal pain.  You feel faint or pass out.  You are unable to urinate.   This information is not intended to replace advice given to you by your health care provider. Make sure you discuss any questions you have with your health care provider.   Document Released: 02/26/2005 Document Revised: 11/17/2014 Document Reviewed: 07/30/2012 Elsevier Interactive Patient Education [employer] Inc[employer]

## 2015-09-21 DIAGNOSIS — M546 Pain in thoracic spine: Secondary | ICD-10-CM | POA: Diagnosis not present

## 2015-09-21 DIAGNOSIS — M542 Cervicalgia: Secondary | ICD-10-CM | POA: Diagnosis not present

## 2015-09-21 DIAGNOSIS — M62830 Muscle spasm of back: Secondary | ICD-10-CM | POA: Diagnosis not present

## 2015-09-21 DIAGNOSIS — M5413 Radiculopathy, cervicothoracic region: Secondary | ICD-10-CM | POA: Diagnosis not present

## 2015-11-03 DIAGNOSIS — F4321 Adjustment disorder with depressed mood: Secondary | ICD-10-CM | POA: Diagnosis not present

## 2015-11-29 DIAGNOSIS — F4321 Adjustment disorder with depressed mood: Secondary | ICD-10-CM | POA: Diagnosis not present

## 2015-12-08 DIAGNOSIS — F4321 Adjustment disorder with depressed mood: Secondary | ICD-10-CM | POA: Diagnosis not present

## 2015-12-29 DIAGNOSIS — F4321 Adjustment disorder with depressed mood: Secondary | ICD-10-CM | POA: Diagnosis not present

## 2016-01-19 DIAGNOSIS — F4321 Adjustment disorder with depressed mood: Secondary | ICD-10-CM | POA: Diagnosis not present

## 2016-02-14 DIAGNOSIS — F4321 Adjustment disorder with depressed mood: Secondary | ICD-10-CM | POA: Diagnosis not present

## 2016-02-24 DIAGNOSIS — I809 Phlebitis and thrombophlebitis of unspecified site: Secondary | ICD-10-CM | POA: Diagnosis not present

## 2016-02-24 MED FILL — IBUPROFEN 800 MG TABLET: 800 | 23 days supply | Qty: 90 | Fill #0

## 2016-02-28 DIAGNOSIS — F4321 Adjustment disorder with depressed mood: Secondary | ICD-10-CM | POA: Diagnosis not present

## 2016-03-08 DIAGNOSIS — F4321 Adjustment disorder with depressed mood: Secondary | ICD-10-CM | POA: Diagnosis not present

## 2016-03-26 DIAGNOSIS — F4321 Adjustment disorder with depressed mood: Secondary | ICD-10-CM | POA: Diagnosis not present

## 2016-05-08 DIAGNOSIS — F4321 Adjustment disorder with depressed mood: Secondary | ICD-10-CM | POA: Diagnosis not present

## 2016-05-12 DIAGNOSIS — M546 Pain in thoracic spine: Secondary | ICD-10-CM | POA: Diagnosis not present

## 2016-05-12 DIAGNOSIS — M62830 Muscle spasm of back: Secondary | ICD-10-CM | POA: Diagnosis not present

## 2016-05-12 DIAGNOSIS — M545 Low back pain: Secondary | ICD-10-CM | POA: Diagnosis not present

## 2016-05-12 DIAGNOSIS — M542 Cervicalgia: Secondary | ICD-10-CM | POA: Diagnosis not present

## 2016-05-17 DIAGNOSIS — F4320 Adjustment disorder, unspecified: Secondary | ICD-10-CM | POA: Diagnosis not present

## 2016-05-24 DIAGNOSIS — F4320 Adjustment disorder, unspecified: Secondary | ICD-10-CM | POA: Diagnosis not present

## 2016-05-31 DIAGNOSIS — F4320 Adjustment disorder, unspecified: Secondary | ICD-10-CM | POA: Diagnosis not present

## 2016-06-01 DIAGNOSIS — M62830 Muscle spasm of back: Secondary | ICD-10-CM | POA: Diagnosis not present

## 2016-06-01 DIAGNOSIS — M546 Pain in thoracic spine: Secondary | ICD-10-CM | POA: Diagnosis not present

## 2016-06-01 DIAGNOSIS — M545 Low back pain: Secondary | ICD-10-CM | POA: Diagnosis not present

## 2016-06-01 DIAGNOSIS — M542 Cervicalgia: Secondary | ICD-10-CM | POA: Diagnosis not present

## 2016-06-06 DIAGNOSIS — M546 Pain in thoracic spine: Secondary | ICD-10-CM | POA: Diagnosis not present

## 2016-06-06 DIAGNOSIS — M542 Cervicalgia: Secondary | ICD-10-CM | POA: Diagnosis not present

## 2016-06-06 DIAGNOSIS — M545 Low back pain: Secondary | ICD-10-CM | POA: Diagnosis not present

## 2016-06-06 DIAGNOSIS — M62830 Muscle spasm of back: Secondary | ICD-10-CM | POA: Diagnosis not present

## 2016-06-13 DIAGNOSIS — F4320 Adjustment disorder, unspecified: Secondary | ICD-10-CM | POA: Diagnosis not present

## 2016-06-14 DIAGNOSIS — F4320 Adjustment disorder, unspecified: Secondary | ICD-10-CM | POA: Diagnosis not present

## 2016-07-03 DIAGNOSIS — F4320 Adjustment disorder, unspecified: Secondary | ICD-10-CM | POA: Diagnosis not present

## 2016-08-27 DIAGNOSIS — F4321 Adjustment disorder with depressed mood: Secondary | ICD-10-CM | POA: Diagnosis not present

## 2016-09-27 DIAGNOSIS — F4321 Adjustment disorder with depressed mood: Secondary | ICD-10-CM | POA: Diagnosis not present

## 2016-10-17 DIAGNOSIS — F4321 Adjustment disorder with depressed mood: Secondary | ICD-10-CM | POA: Diagnosis not present

## 2016-12-21 DIAGNOSIS — M6289 Other specified disorders of muscle: Secondary | ICD-10-CM | POA: Diagnosis not present

## 2016-12-21 DIAGNOSIS — R5383 Other fatigue: Secondary | ICD-10-CM | POA: Diagnosis not present

## 2016-12-22 DIAGNOSIS — R739 Hyperglycemia, unspecified: Secondary | ICD-10-CM | POA: Diagnosis not present

## 2016-12-22 DIAGNOSIS — R5383 Other fatigue: Secondary | ICD-10-CM | POA: Diagnosis not present

## 2016-12-22 DIAGNOSIS — M6289 Other specified disorders of muscle: Secondary | ICD-10-CM | POA: Diagnosis not present

## 2017-05-03 DIAGNOSIS — F4320 Adjustment disorder, unspecified: Secondary | ICD-10-CM | POA: Diagnosis not present

## 2017-05-28 DIAGNOSIS — F4320 Adjustment disorder, unspecified: Secondary | ICD-10-CM | POA: Diagnosis not present

## 2017-06-29 ENCOUNTER — Telehealth: Payer: 59 | Admitting: Physician Assistant

## 2017-06-29 DIAGNOSIS — H01001 Unspecified blepharitis right upper eyelid: Secondary | ICD-10-CM | POA: Diagnosis not present

## 2017-06-29 MED ORDER — POLYMYXIN B-TRIMETHOPRIM 10000-0.1 UNIT/ML-% OP SOLN: OPHTHALMIC | 0 refills | Status: DC

## 2017-06-29 NOTE — Progress Notes (Signed)
We are sorry that you are not feeling well.  Here is how we plan to help!  Based on what you have shared with me it looks like you have blepharitis. Blepharitis is a common inflammatory or infectious condition of the eyelid. In most cases it is bacterial.   I have prescribed Polytrim Ophthalmic drops 1-2 drops 4 times a day times 5 days    Strict handwashing is suggested with soap and water is urged.  If not available, use alcohol based had sanitizer.  Avoid unnecessary touching of the eye.  If you wear contact lenses, you will need to refrain from wearing them until you see no white discharge from the eye for at least 24 hours after being on medication.  You should see symptom improvement in 1-2 days after starting the medication regimen.  Call us if symptoms are not improved in 1-2 days.  Home Care:  Wash your hands often!  Do not wear your contacts until you complete your treatment plan.  Avoid sharing towels, bed linen, personal items with a person who has pink eye.  See attention for anyone in your home with similar symptoms.  Get Help Right Away If:  Your symptoms do not improve.  You develop blurred or loss of vision.  Your symptoms worsen (increased discharge, pain or redness)  Your e-visit answers were reviewed by a board certified advanced clinical practitioner to complete your personal care plan.  Depending on the condition, your plan could have included both over the counter or prescription medications.  If there is a problem please reply  once you have received a response from your provider.  Your safety is important to us.  If you have drug allergies check your prescription carefully.    You can use MyChart to ask questions about today's visit, request a non-urgent call back, or ask for a work or school excuse for 24 hours related to this e-Visit. If it has been greater than 24 hours you will need to follow up with your provider, or enter a new e-Visit to address those  concerns.   You will get an e-mail in the next two days asking about your experience.  I hope that your e-visit has been valuable and will speed your recovery. Thank you for using e-visits.

## 2017-08-19 ENCOUNTER — Ambulatory Visit (INDEPENDENT_AMBULATORY_CARE_PROVIDER_SITE_OTHER): Payer: Self-pay | Admitting: Nurse Practitioner

## 2017-08-19 VITALS — BP 118/76 | HR 66 | Temp 98.5°F | Resp 20 | Ht 75.0 in | Wt 213.0 lb

## 2017-08-19 DIAGNOSIS — Z0000 Encounter for general adult medical examination without abnormal findings: Secondary | ICD-10-CM

## 2017-08-19 NOTE — Patient Instructions (Signed)
Preventive Care 40-64 Years, Male Preventive care refers to lifestyle choices and visits with your health care provider that can promote health and wellness. What does preventive care include?  A yearly physical exam. This is also called an annual well check.  Dental exams once or twice a year.  Routine eye exams. Ask your health care provider how often you should have your eyes checked.  Personal lifestyle choices, including: ? Daily care of your teeth and gums. ? Regular physical activity. ? Eating a healthy diet. ? Avoiding tobacco and drug use. ? Limiting alcohol use. ? Practicing safe sex. ? Taking low-dose aspirin every day starting at age 45. What happens during an annual well check? The services and screenings done by your health care provider during your annual well check will depend on your age, overall health, lifestyle risk factors, and family history of disease. Counseling Your health care provider may ask you questions about your:  Alcohol use.  Tobacco use.  Drug use.  Emotional well-being.  Home and relationship well-being.  Sexual activity.  Eating habits.  Work and work Statistician.  Screening You may have the following tests or measurements:  Height, weight, and BMI.  Blood pressure.  Lipid and cholesterol levels. These may be checked every 5 years, or more frequently if you are over 76 years old.  Skin check.  Lung cancer screening. You may have this screening every year starting at age 45 if you have a 30-pack-year history of smoking and currently smoke or have quit within the past 15 years.  Fecal occult blood test (FOBT) of the stool. You may have this test every year starting at age 45.  Flexible sigmoidoscopy or colonoscopy. You may have a sigmoidoscopy every 5 years or a colonoscopy every 10 years starting at age 45.  Prostate cancer screening. Recommendations will vary depending on your family history and other risks.  Hepatitis C  blood test.  Hepatitis B blood test.  Sexually transmitted disease (STD) testing.  Diabetes screening. This is done by checking your blood sugar (glucose) after you have not eaten for a while (fasting). You may have this done every 1-3 years.  Discuss your test results, treatment options, and if necessary, the need for more tests with your health care provider. Vaccines Your health care provider may recommend certain vaccines, such as:  Influenza vaccine. This is recommended every year.  Tetanus, diphtheria, and acellular pertussis (Tdap, Td) vaccine. You may need a Td booster every 10 years.  Varicella vaccine. You may need this if you have not been vaccinated.  Zoster vaccine. You may need this after age 45.  Measles, mumps, and rubella (MMR) vaccine. You may need at least one dose of MMR if you were born in 1957 or later. You may also need a second dose.  Pneumococcal 13-valent conjugate (PCV13) vaccine. You may need this if you have certain conditions and have not been vaccinated.  Pneumococcal polysaccharide (PPSV23) vaccine. You may need one or two doses if you smoke cigarettes or if you have certain conditions.  Meningococcal vaccine. You may need this if you have certain conditions.  Hepatitis A vaccine. You may need this if you have certain conditions or if you travel or work in places where you may be exposed to hepatitis A.  Hepatitis B vaccine. You may need this if you have certain conditions or if you travel or work in places where you may be exposed to hepatitis B.  Haemophilus influenzae type b (Hib) vaccine.  You may need this if you have certain risk factors.  Talk to your health care provider about which screenings and vaccines you need and how often you need them. This information is not intended to replace advice given to you by your health care provider. Make sure you discuss any questions you have with your health care provider. Document Released: 03/25/2015  Document Revised: 11/16/2015 Document Reviewed: 12/28/2014 Elsevier Interactive Patient Education  2018 Elsevier Inc.  Health Maintenance, Male A healthy lifestyle and preventive care is important for your health and wellness. Ask your health care provider about what schedule of regular examinations is right for you. What should I know about weight and diet? Eat a Healthy Diet  Eat plenty of vegetables, fruits, whole grains, low-fat dairy products, and lean protein.  Do not eat a lot of foods high in solid fats, added sugars, or salt.  Maintain a Healthy Weight Regular exercise can help you achieve or maintain a healthy weight. You should:  Do at least 150 minutes of exercise each week. The exercise should increase your heart rate and make you sweat (moderate-intensity exercise).  Do strength-training exercises at least twice a week.  Watch Your Levels of Cholesterol and Blood Lipids  Have your blood tested for lipids and cholesterol every 5 years starting at 45 years of age. If you are at high risk for heart disease, you should start having your blood tested when you are 45 years old. You may need to have your cholesterol levels checked more often if: ? Your lipid or cholesterol levels are high. ? You are older than 45 years of age. ? You are at high risk for heart disease.  What should I know about cancer screening? Many types of cancers can be detected early and may often be prevented. Lung Cancer  You should be screened every year for lung cancer if: ? You are a current smoker who has smoked for at least 30 years. ? You are a former smoker who has quit within the past 15 years.  Talk to your health care provider about your screening options, when you should start screening, and how often you should be screened.  Colorectal Cancer  Routine colorectal cancer screening usually begins at 45 years of age and should be repeated every 5-10 years until you are 45 years old. You may  need to be screened more often if early forms of precancerous polyps or small growths are found. Your health care provider may recommend screening at an earlier age if you have risk factors for colon cancer.  Your health care provider may recommend using home test kits to check for hidden blood in the stool.  A small camera at the end of a tube can be used to examine your colon (sigmoidoscopy or colonoscopy). This checks for the earliest forms of colorectal cancer.  Prostate and Testicular Cancer  Depending on your age and overall health, your health care provider may do certain tests to screen for prostate and testicular cancer.  Talk to your health care provider about any symptoms or concerns you have about testicular or prostate cancer.  Skin Cancer  Check your skin from head to toe regularly.  Tell your health care provider about any new moles or changes in moles, especially if: ? There is a change in a mole's size, shape, or color. ? You have a mole that is larger than a pencil eraser.  Always use sunscreen. Apply sunscreen liberally and repeat throughout the day.    Protect yourself by wearing long sleeves, pants, a wide-brimmed hat, and sunglasses when outside.  What should I know about heart disease, diabetes, and high blood pressure?  If you are 18-39 years of age, have your blood pressure checked every 3-5 years. If you are 40 years of age or older, have your blood pressure checked every year. You should have your blood pressure measured twice-once when you are at a hospital or clinic, and once when you are not at a hospital or clinic. Record the average of the two measurements. To check your blood pressure when you are not at a hospital or clinic, you can use: ? An automated blood pressure machine at a pharmacy. ? A home blood pressure monitor.  Talk to your health care provider about your target blood pressure.  If you are between 45-79 years old, ask your health care  provider if you should take aspirin to prevent heart disease.  Have regular diabetes screenings by checking your fasting blood sugar level. ? If you are at a normal weight and have a low risk for diabetes, have this test once every three years after the age of 45. ? If you are overweight and have a high risk for diabetes, consider being tested at a younger age or more often.  A one-time screening for abdominal aortic aneurysm (AAA) by ultrasound is recommended for men aged 65-75 years who are current or former smokers. What should I know about preventing infection? Hepatitis B If you have a higher risk for hepatitis B, you should be screened for this virus. Talk with your health care provider to find out if you are at risk for hepatitis B infection. Hepatitis C Blood testing is recommended for:  Everyone born from 1945 through 1965.  Anyone with known risk factors for hepatitis C.  Sexually Transmitted Diseases (STDs)  You should be screened each year for STDs including gonorrhea and chlamydia if: ? You are sexually active and are younger than 45 years of age. ? You are older than 45 years of age and your health care provider tells you that you are at risk for this type of infection. ? Your sexual activity has changed since you were last screened and you are at an increased risk for chlamydia or gonorrhea. Ask your health care provider if you are at risk.  Talk with your health care provider about whether you are at high risk of being infected with HIV. Your health care provider may recommend a prescription medicine to help prevent HIV infection.  What else can I do?  Schedule regular health, dental, and eye exams.  Stay current with your vaccines (immunizations).  Do not use any tobacco products, such as cigarettes, chewing tobacco, and e-cigarettes. If you need help quitting, ask your health care provider.  Limit alcohol intake to no more than 2 drinks per day. One drink equals 12  ounces of beer, 5 ounces of wine, or 1 ounces of hard liquor.  Do not use street drugs.  Do not share needles.  Ask your health care provider for help if you need support or information about quitting drugs.  Tell your health care provider if you often feel depressed.  Tell your health care provider if you have ever been abused or do not feel safe at home. This information is not intended to replace advice given to you by your health care provider. Make sure you discuss any questions you have with your health care provider. Document Released: 08/25/2007 Document   Revised: 10/26/2015 Document Reviewed: 11/30/2014 Elsevier Interactive Patient Education  2018 Elsevier Inc.  

## 2017-08-19 NOTE — Progress Notes (Addendum)
Subjective:  Joshua Randolph is a 47 y.o. male who presents for basic physical exam. Patient denies any current health related concerns.  Patient denies any past medical history, but does have a history of seasonal allergies.  Patient states he takes over-the-counter nasal spray that keeps his symptoms under control at this time.  The patient denies any history of heart disease, lung disease, kidney disease, diabetes, seizures, or asthma.  The patient states he did have a history of hyperlipidemia, but that has since resolved/improved since he has lost weight.  Viewed the patient's past medical history, current medications, and allergies.  The patient has a surgical history of ankle surgery and a laminectomy.  Any problems following surgery.  The patient denies use of recreational drugs and does not smoke, but does drink alcohol socially..  The patient is married, and lives at home with his wife and 2 children ages 8810 and 4512.  The patient states is a history of diabetes mellitus on his mother side but denies any pertinent family medical history.   There is no immunization history on file for this patient.  Past Medical History:  Diagnosis Date  . Hyperlipemia     Past Surgical History:  Procedure Laterality Date  . ANKLE SURGERY    . LAMINECTOMY      Social History   Tobacco Use  . Smoking status: Never Smoker  Substance Use Topics  . Alcohol use: Yes    Comment: rarely  . Drug use: No    No Known Allergies  Current Outpatient Medications  Medication Sig Dispense Refill  . fluticasone (FLONASE) 50 MCG/ACT nasal spray Place into both nostrils daily.    Marland Kitchen. atorvastatin (LIPITOR) 10 MG tablet Take 10 mg by mouth daily.    . ondansetron (ZOFRAN ODT) 4 MG disintegrating tablet Take 1 tablet (4 mg total) by mouth every 8 (eight) hours as needed for nausea or vomiting. (Patient not taking: Reported on 08/19/2017) 20 tablet 0  . oxyCODONE-acetaminophen (PERCOCET/ROXICET) 5-325 MG  tablet Take 1-2 tablets by mouth every 6 (six) hours as needed for severe pain. (Patient not taking: Reported on 08/19/2017) 10 tablet 0  . trimethoprim-polymyxin b (POLYTRIM) ophthalmic solution Apply 1-2 drops to affected eye QID x 5 days. (Patient not taking: Reported on 08/19/2017) 10 mL 0   No current facility-administered medications for this visit.     Review of Systems  Constitutional: Negative.   HENT: Negative.   Eyes: Negative.   Respiratory: Negative.   Cardiovascular: Negative.   Gastrointestinal: Negative.   Genitourinary: Negative.   Musculoskeletal: Negative.   Skin: Negative.   Neurological: Negative.   Endo/Heme/Allergies: Positive for environmental allergies.  Psychiatric/Behavioral: Negative.      Objective:  BP 118/76 (BP Location: Right Arm, Patient Position: Sitting, Cuff Size: Normal)   Pulse 66   Temp 98.5 F (36.9 C) (Oral)   Resp 20   Ht 6\' 3"  (1.905 m)   Wt 213 lb (96.6 kg)   SpO2 98%   BMI 26.62 kg/m   General Appearance:  Alert, cooperative, no distress, appears stated age  Head:  Normocephalic, without obvious abnormality, atraumatic  Eyes:  PERRL, conjunctiva/corneas clear, EOM's intact, fundi benign, both eyes  Ears:  Normal TM's and external ear canals, both ears  Nose: Nares normal, septum midline, mucosa normal, no drainage or sinus tenderness  Throat: Lips, mucosa, and tongue normal; teeth and gums normal  Neck: Supple, symmetrical, trachea midline, no adenopathy, thyroid: not enlarged, symmetric, no tenderness/mass/nodules, no carotid  bruit or JVD  Back:   Symmetric, no curvature, ROM normal, no CVA tenderness  Lungs:   Clear to auscultation bilaterally, respirations unlabored  Chest Wall:  No tenderness or deformity  Heart:  Regular rate and rhythm, S1, S2 normal, no murmur, rub or gallop  Abdomen:   Soft, non-tender, bowel sounds active all four quadrants,  no masses, no organomegaly  Genitalia:  Deferred  Rectal:  Deferred   Extremities: Extremities normal, atraumatic, no cyanosis or edema  Pulses: 2+ and symmetric  Skin: Skin color, texture, turgor normal, no rashes or lesions  Lymph nodes: Cervical, supraclavicular, and axillary nodes normal  Neurologic: Normal      Assessment:  basic physical exam    Plan:  Patient education provided.  Patient given information for health prevention and health maintenance for his age group.  Patient instructed to follow-up with PCP for further lab work, EKG or other follow-up.  Patient denied any need for any other medications at this time.  Patient verbalizes understanding and has no questions at time of discharge.Patient will follow up with PCP.

## 2018-10-17 ENCOUNTER — Ambulatory Visit (INDEPENDENT_AMBULATORY_CARE_PROVIDER_SITE_OTHER): Payer: Self-pay | Admitting: Physician Assistant

## 2018-10-17 ENCOUNTER — Other Ambulatory Visit: Payer: Self-pay

## 2018-10-17 VITALS — BP 100/74 | HR 71 | Temp 98.4°F | Resp 20 | Ht 76.0 in | Wt 217.0 lb

## 2018-10-17 DIAGNOSIS — Z0000 Encounter for general adult medical examination without abnormal findings: Secondary | ICD-10-CM

## 2018-10-17 NOTE — Progress Notes (Signed)
Patient ID: Joshua Randolph DOB: 01/04/1971 AGE: 48 y.o. MRN: [id number]   PCP: Loyal JacobsonKalish, Michael, MD   Chief Complaint:  Chief Complaint  Patient presents with  . Annual Exam     Subjective:    HPI:  Joshua Randolph is a 48 y.o. male presents for evaluation  Chief Complaint  Patient presents with  . Annual Exam    48 year old male presents to [facility] with request for annual physical examination (health [facility]   Patient had [facility] physical at [facility] last year, 08/19/2017.  Patient last seen by previous PCP, Laureen OchsMegan Hunter FNP with [facility], on 12/21/2016. Seen for fatigue. Labs performed; CBC, iron profile, TSH, CMP, VitD, and testosterone. Only abnormality, slightly elevated blood glucose. Follow-up A1C was 5.1.  Patient had hyperlipidemia for a few years. Was on Atorvastatin. Discontinued 2 years ago when patient lost 40lbs. Has regained approximately 20lbs. States he is going to get more serious about a healthy diet and exercise.  Patient with seasonal allergies. Typically in Spring season. Currently feeling well. Few months ago used over the counter antihistamine and Flonase for symptom control.  No complaints or concerns today.  A limited review of symptoms was performed, pertinent positives and negatives as mentioned in HPI.  The following portions of the patient's history were reviewed and updated as appropriate: allergies, current medications and past medical history.  Patient Active Problem List   Diagnosis Date Noted  . HYPERLIPIDEMIA 11/02/2009  . FEVER UNSPECIFIED 11/02/2009  . HEADACHE 11/02/2009  . UNSPECIFIED OTITIS MEDIA 08/04/2009  . ACUTE SINUSITIS, UNSPECIFIED 08/04/2009    No Known Allergies  Current Outpatient Medications on File Prior to Visit  Medication Sig Dispense Refill  . fluticasone (FLONASE) 50 MCG/ACT nasal spray Place into both nostrils daily.     Marland Kitchen. atorvastatin (LIPITOR) 10 MG tablet Take 10 mg by mouth daily.    . ondansetron (ZOFRAN ODT) 4 MG disintegrating tablet Take 1 tablet (4 mg total) by mouth every 8 (eight) hours as needed for nausea or vomiting. (Patient not taking: Reported on 08/19/2017) 20 tablet 0  . oxyCODONE-acetaminophen (PERCOCET/ROXICET) 5-325 MG tablet Take 1-2 tablets by mouth every 6 (six) hours as needed for severe pain. (Patient not taking: Reported on 08/19/2017) 10 tablet 0  . trimethoprim-polymyxin b (POLYTRIM) ophthalmic solution Apply 1-2 drops to affected eye QID x 5 days. (Patient not taking: Reported on 08/19/2017) 10 mL 0   No current facility-administered medications on file prior to visit.        Objective:   Vitals:   10/17/18 1412  BP: 100/74  Pulse: 71  Resp: 20  Temp: 98.4 F (36.9 C)  SpO2: 97%     Wt Readings from Last 3 Encounters:  10/17/18 217 lb (98.4 kg)  08/19/17 213 lb (96.6 kg)  09/07/15 187 lb (84.8 kg)    Physical Exam:   General Appearance:  Patient sitting comfortably on examination table. Conversational. Peri JeffersonGood self-historian. In no acute distress. Afebrile.   Head:  Normocephalic, without obvious abnormality, atraumatic  Eyes:  PERRL, conjunctiva/corneas clear, EOM's intact  Ears:  Left ear canal WNL. No erythema or edema. No open wound. No visible purulent drainage. No tenderness with palpation over left tragus or with manipulation of left auricle. No visible erythema or edema of left mastoid. No tenderness with palpation over left mastoid. Right ear canal WNL. No erythema or edema. No open wound. No visible purulent drainage. No tenderness  with palpation over right tragus or with manipulation of right auricle. No visible erythema or edema of right mastoid. No tenderness with palpation over right mastoid. Left TM WNL. Good light reflex. Visible landmarks. No erythema. No injection. No bulging or retraction. No visible perforation. No serous effusion. No visible purulent  effusion. No tympanostomy tube. No scar tissue. Right TM WNL. Good light reflex. Visible landmarks. No erythema. No injection. No bulging or retraction. No visible perforation. No serous effusion. No visible purulent effusion. No tympanostomy tube. No scar tissue.  Nose: Nares normal. Septum midline. No visible polyps. No discharge. Normal mucosa. No sinus tenderness with percussion/palpation.  Throat: Lips, mucosa, and tongue normal; teeth and gums normal. Throat reveals no erythema. No postnasal drip. No visible cobblestoning. Tonsils with no enlargement or exudate. Uvula midline with no edema or erythema.  Neck: Supple, symmetrical, trachea midline, no adenopathy  Lungs:   Clear to auscultation bilaterally, respirations unlabored. Good aeration. No rales, rhonchi, crackles or wheezing.  Heart:  Regular rate and rhythm, S1 and S2 normal, no murmur, rub, or gallop  Abdomen:   Normal to inspection. Normoactive bowel sounds. No tenderness with palpation. No guarding, rigidity or rebound tenderness. No palpable organomegaly.  Extremities: Extremities normal, atraumatic, no cyanosis or edema  Pulses: 2+ and symmetric  Skin: Skin color, texture, turgor normal, no rashes or lesions  Lymph nodes: Cervical, supraclavicular, and axillary nodes normal  Neurologic: Normal    Assessment & Plan:    Exam findings, diagnosis etiology and medication use and indications reviewed with patient. Follow-Up and discharge instructions provided. No emergent/urgent issues found on exam.  Patient education was provided.   Patient verbalized understanding of information provided and agrees with plan of care (POC), all questions answered. The patient is advised to call or return to clinic if condition does not see an improvement in symptoms, or to seek the care of the closest emergency department if condition worsens with the below plan.    1. Encounter for annual physical exam  48 year old male presents for annual  physical exam (health insurance requirement). Patient with diet-controlled hyperlipidemia and seasonal allergies. Not currently using any OTC or prescription medications regularly. Benign physical examination; including vision and hearing screening WNL. No complaints/concerns today. Advised patient continue to follow-up with PCP for health management/maintenance. Patient agreed with plan.   Darlin Priestly, MHS, PA-C Montey Hora, MHS, PA-C Advanced Practice Provider [facility]  [place], [place] [address] (p): [phone] [email or web address]

## 2018-10-17 NOTE — Patient Instructions (Addendum)
Thank you for choosing [facility] for your health care needs.  You have been seen for an annual physical (health insurance requirement).  No concerns on today's visit.  Recommend continued regular visits with family physician for continued health maintenance including blood work, EKG, prostate exam, etc.  Health Maintenance, Male Adopting a healthy lifestyle and getting preventive care are important in promoting health and wellness. Ask your health care provider about:  The right schedule for you to have regular tests and exams.  Things you can do on your own to prevent diseases and keep yourself healthy. What should I know about diet, weight, and exercise? Eat a healthy diet   Eat a diet that includes plenty of vegetables, fruits, low-fat dairy products, and lean protein.  Do not eat a lot of foods that are high in solid fats, added sugars, or sodium. Maintain a healthy weight Body mass index (BMI) is a measurement that can be used to identify possible weight problems. It estimates body fat based on height and weight. Your health care provider can help determine your BMI and help you achieve or maintain a healthy weight. Get regular exercise Get regular exercise. This is one of the most important things you can do for your health. Most adults should:  Exercise for at least 150 minutes each week. The exercise should increase your heart rate and make you sweat (moderate-intensity exercise).  Do strengthening exercises at least twice a week. This is in addition to the moderate-intensity exercise.  Spend less time sitting. Even light physical activity can be beneficial. Watch cholesterol and blood lipids Have your blood tested for lipids and cholesterol at 46 years of age, then have this test every 5 years. You may need to have your cholesterol levels checked more often if:  Your lipid or cholesterol levels are high.  You are older than 46 years of age.  You are at high risk for  heart disease. What should I know about cancer screening? Many types of cancers can be detected early and may often be prevented. Depending on your health history and family history, you may need to have cancer screening at various ages. This may include screening for:  Colorectal cancer.  Prostate cancer.  Skin cancer.  Lung cancer. What should I know about heart disease, diabetes, and high blood pressure? Blood pressure and heart disease  High blood pressure causes heart disease and increases the risk of stroke. This is more likely to develop in people who have high blood pressure readings, are of African descent, or are overweight.  Talk with your health care provider about your target blood pressure readings.  Have your blood pressure checked: ? Every 3-5 years if you are 74-82 years of age. ? Every year if you are 77 years old or older.  If you are between the ages of 65 and 52 and are a current or former smoker, ask your health care provider if you should have a one-time screening for abdominal aortic aneurysm (AAA). Diabetes Have regular diabetes screenings. This checks your fasting blood sugar level. Have the screening done:  Once every three years after age 39 if you are at a normal weight and have a low risk for diabetes.  More often and at a younger age if you are overweight or have a high risk for diabetes. What should I know about preventing infection? Hepatitis B If you have a higher risk for hepatitis B, you should be screened for this virus. Talk with your health  care provider to find out if you are at risk for hepatitis B infection. Hepatitis C Blood testing is recommended for:  Everyone born from [birth year] through 1965.  Anyone with known risk factors for hepatitis C. Sexually transmitted infections (STIs)  You should be screened each year for STIs, including gonorrhea and chlamydia, if: ? You are sexually active and are younger than 46 years of age. ? You are  older than 46 years of age and your health care provider tells you that you are at risk for this type of infection. ? Your sexual activity has changed since you were last screened, and you are at increased risk for chlamydia or gonorrhea. Ask your health care provider if you are at risk.  Ask your health care provider about whether you are at high risk for HIV. Your health care provider may recommend a prescription medicine to help prevent HIV infection. If you choose to take medicine to prevent HIV, you should first get tested for HIV. You should then be tested every 3 months for as long as you are taking the medicine. Follow these instructions at home: Lifestyle  Do not use any products that contain nicotine or tobacco, such as cigarettes, e-cigarettes, and chewing tobacco. If you need help quitting, ask your health care provider.  Do not use street drugs.  Do not share needles.  Ask your health care provider for help if you need support or information about quitting drugs. Alcohol use  Do not drink alcohol if your health care provider tells you not to drink.  If you drink alcohol: ? Limit how much you have to 0-2 drinks a day. ? Be aware of how much alcohol is in your drink. In the [place] one drink equals one 12 oz bottle of beer (355 mL), one 5 oz glass of wine (148 mL), or one 1 oz glass of hard liquor (44 mL). General instructions  Schedule regular health, dental, and eye exams.  Stay current with your vaccines.  Tell your health care provider if: ? You often feel depressed. ? You have ever been abused or do not feel safe at home. Summary  Adopting a healthy lifestyle and getting preventive care are important in promoting health and wellness.  Follow your health care provider's instructions about healthy diet, exercising, and getting tested or screened for diseases.  Follow your health care provider's instructions on monitoring your cholesterol and blood pressure. This  information is not intended to replace advice given to you by your health care provider. Make sure you discuss any questions you have with your health care provider. Document Released: 08/25/2007 Document Revised: 02/19/2018 Document Reviewed: 02/19/2018 Elsevier Patient Education  2020 [employer]

## 2018-11-09 ENCOUNTER — Telehealth: Payer: Self-pay

## 2018-11-09 ENCOUNTER — Emergency Department
Admission: EM | Admit: 2018-11-09 | Discharge: 2018-11-09 | Disposition: A | Discharge status: Home / Self Care | Payer: 59 | Source: Home / Self Care | Attending: Family Medicine | Admitting: Family Medicine

## 2018-11-09 ENCOUNTER — Other Ambulatory Visit: Payer: Self-pay

## 2018-11-09 ENCOUNTER — Emergency Department (INDEPENDENT_AMBULATORY_CARE_PROVIDER_SITE_OTHER): Payer: 59

## 2018-11-09 DIAGNOSIS — S62112A Displaced fracture of triquetrum [cuneiform] bone, left wrist, initial encounter for closed fracture: Secondary | ICD-10-CM

## 2018-11-09 DIAGNOSIS — M25532 Pain in left wrist: Secondary | ICD-10-CM | POA: Diagnosis not present

## 2018-11-09 NOTE — Discharge Instructions (Addendum)
Wear brace.  Elevate hand/wrist.  Apply ice pack for 20 to 30 minutes, 3 to 4 times daily  Continue until pain and swelling decrease.  May take Tylenol as needed for pain.

## 2018-11-09 NOTE — Telephone Encounter (Signed)
Pt called requesting to be referred to Sports Medicine in [place] instead of Dr T in [place].. Advised pt ok to call and make appt there if preferred.
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
## 2018-11-10 ENCOUNTER — Other Ambulatory Visit: Payer: Self-pay

## 2018-11-10 ENCOUNTER — Encounter: Payer: Self-pay | Admitting: Family Medicine

## 2018-11-10 ENCOUNTER — Ambulatory Visit: Payer: 59 | Admitting: Family Medicine

## 2018-11-10 VITALS — BP 110/70 | Ht 76.0 in | Wt 212.0 lb

## 2018-11-10 DIAGNOSIS — S62112A Displaced fracture of triquetrum [cuneiform] bone, left wrist, initial encounter for closed fracture: Secondary | ICD-10-CM | POA: Insufficient documentation
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
## 2018-11-10 NOTE — Patient Instructions (Signed)
Nice to meet you Please take vitamin K2, vitamin D and calcium  Pleas stay in the splint. You can change the wrapping if needed  Please send me a message in MyChart with any questions or updates.  Please see me back in 2 weeks.   --Dr. Raeford Razor

## 2018-11-24 ENCOUNTER — Ambulatory Visit: Payer: 59 | Admitting: Family Medicine

## 2018-11-24 ENCOUNTER — Other Ambulatory Visit: Payer: Self-pay

## 2018-11-24 ENCOUNTER — Encounter: Payer: Self-pay | Admitting: Family Medicine

## 2018-11-24 VITALS — BP 111/72 | Ht 76.0 in | Wt 210.0 lb

## 2018-11-24 DIAGNOSIS — S62112A Displaced fracture of triquetrum [cuneiform] bone, left wrist, initial encounter for closed fracture: Secondary | ICD-10-CM

## 2018-11-24 NOTE — Patient Instructions (Signed)
Good to see you Please wear the splint intermittently  Please work on the range of motion exercises  Please try ice as needed  Please send me a message in MyChart with any questions or updates.  Please see me back in 4 weeks.   --Dr. Raeford Razor

## 2018-11-24 NOTE — Assessment & Plan Note (Signed)
Improvement with the volar splint.  Improvement in pain and swelling. -Intermittently can wear the volar splint -Counseled on rehab exercises and supportive care. -Can follow-up in 1 month.  Consider imaging at that time.  Could consider physical therapy to help with range of motion.

## 2018-11-24 NOTE — Progress Notes (Signed)
[facility] - 48 y.o. male MRN [id number]  Date of birth: 1970/08/12  SUBJECTIVE:  Including CC & ROS.  No chief complaint on file.   [facility] is a 48 y.o. male that is following up for his left wrist triquetral fracture.  The initial injury occurred on 8/29.  He has been in a volar splint since being seen on 8/31.  Reports improvement of the pain.  The pain is mild and intermittent.  No significant ecchymosis or swelling.  Does have some limitations in flexion.   Review of Systems  Constitutional: Negative for fever.  HENT: Negative for congestion.   Respiratory: Negative for cough.   Cardiovascular: Negative for chest pain.  Gastrointestinal: Negative for abdominal pain.  Musculoskeletal: Negative for gait problem.  Neurological: Negative for weakness.  Hematological: Negative for adenopathy.    HISTORY: Past Medical, Surgical, Social, and Family History Reviewed & Updated per EMR.   Pertinent Historical Findings include:  Past Medical History:  Diagnosis Date  . Hyperlipemia     Past Surgical History:  Procedure Laterality Date  . ANKLE SURGERY    . LAMINECTOMY      No Known Allergies  No family history on file.   Social History   Socioeconomic History  . Marital status: Married    Spouse name: Not on file  . Number of children: Not on file  . Years of education: Not on file  . Highest education level: Not on file  Occupational History  . Not on file  Social Needs  . Financial resource strain: Not on file  . Food insecurity    Worry: Not on file    Inability: Not on file  . Transportation needs    Medical: Not on file    Non-medical: Not on file  Tobacco Use  . Smoking status: Never Smoker  . Smokeless tobacco: Never Used  Substance and Sexual Activity  . Alcohol use: Yes    Comment: rarely  . Drug use: No  . Sexual activity: Not on file  Lifestyle  . Physical activity    Days per week: Not on file    Minutes per session: Not on file  .  Stress: Not on file  Relationships  . Social Herbalist on phone: Not on file    Gets together: Not on file    Attends religious service: Not on file    Active member of club or organization: Not on file    Attends meetings of clubs or organizations: Not on file    Relationship status: Not on file  . Intimate partner violence    Fear of current or ex partner: Not on file    Emotionally abused: Not on file    Physically abused: Not on file    Forced sexual activity: Not on file  Other Topics Concern  . Not on file  Social History Narrative  . Not on file     PHYSICAL EXAM:  VS: There were no vitals taken for this visit. Physical Exam Gen: NAD, alert, cooperative with exam, well-appearing ENT: normal lips, normal nasal mucosa,  Eye: normal EOM, normal conjunctiva and lids CV:  no edema, +2 pedal pulses   Resp: no accessory muscle use, non-labored,  Skin: no rashes, no areas of induration  Neuro: normal tone, normal sensation to touch Psych:  normal insight, alert and oriented MSK:  Left wrist:  Limited flexion. Near normal extension. Normal grip strength. No significant ecchymosis or swelling. Tenderness  to palpation over the dorsal carpal bones. Neurovascular intact     ASSESSMENT & PLAN:   Triquetral chip fracture, left, closed, initial encounter Improvement with the volar splint.  Improvement in pain and swelling. -Intermittently can wear the volar splint -Counseled on rehab exercises and supportive care. -Can follow-up in 1 month.  Consider imaging at that time.  Could consider physical therapy to help with range of motion.

## 2018-12-22 ENCOUNTER — Ambulatory Visit: Payer: 59 | Admitting: Family Medicine

## 2019-01-27 ENCOUNTER — Ambulatory Visit (INDEPENDENT_AMBULATORY_CARE_PROVIDER_SITE_OTHER): Payer: 59 | Admitting: Otolaryngology

## 2019-01-27 ENCOUNTER — Encounter (INDEPENDENT_AMBULATORY_CARE_PROVIDER_SITE_OTHER): Payer: Self-pay | Admitting: Otolaryngology

## 2019-01-27 ENCOUNTER — Other Ambulatory Visit: Payer: Self-pay

## 2019-01-27 VITALS — Temp 97.7°F

## 2019-01-27 DIAGNOSIS — H6121 Impacted cerumen, right ear: Secondary | ICD-10-CM

## 2019-01-27 DIAGNOSIS — J310 Chronic rhinitis: Secondary | ICD-10-CM | POA: Diagnosis not present

## 2019-01-27 NOTE — Progress Notes (Addendum)
HPI: Joshua Randolph is a 48 y.o. male who presents for evaluation of an irregularity seen on dental scans. Patient was at his dentist a week ago for a regular check-up and routine dental XR showed an irregularity in his right maxillary sinus. Patient denies any sinus problems. He denies facial pain/pressure, purulent drainage, nasal congestion or dental pain. He does not have a history of allergies. He is otherwise healthy. Patient states he was told he had Meniere disease five years ago after two episodes of vertigo. He has not been able to hear out of his right ear as well since that time. He denies any further vertigo.  Past Medical History:  Diagnosis Date  . Hyperlipemia    Past Surgical History:  Procedure Laterality Date  . ANKLE SURGERY    . LAMINECTOMY     Social History   Socioeconomic History  . Marital status: Married    Spouse name: Not on file  . Number of children: Not on file  . Years of education: Not on file  . Highest education level: Not on file  Occupational History  . Not on file  Social Needs  . Financial resource strain: Not on file  . Food insecurity    Worry: Not on file    Inability: Not on file  . Transportation needs    Medical: Not on file    Non-medical: Not on file  Tobacco Use  . Smoking status: Never Smoker  . Smokeless tobacco: Never Used  Substance and Sexual Activity  . Alcohol use: Yes    Comment: rarely  . Drug use: No  . Sexual activity: Not on file  Lifestyle  . Physical activity    Days per week: Not on file    Minutes per session: Not on file  . Stress: Not on file  Relationships  . Social Herbalist on phone: Not on file    Gets together: Not on file    Attends religious service: Not on file    Active member of club or organization: Not on file    Attends meetings of clubs or organizations: Not on file    Relationship status: Not on file  Other Topics Concern  . Not on file  Social History Narrative  . Not on  file   No family history on file. No Known Allergies Prior to Admission medications   Medication Sig Start Date End Date Taking? Authorizing Provider  atorvastatin (LIPITOR) 10 MG tablet Take 10 mg by mouth daily.   Yes [provider]  fluticasone (FLONASE) 50 MCG/ACT nasal spray Place into both nostrils daily.   Yes [provider]  ondansetron (ZOFRAN ODT) 4 MG disintegrating tablet Take 1 tablet (4 mg total) by mouth every 8 (eight) hours as needed for nausea or vomiting. 09/07/15  Yes Horton, Barbette Hair, MD  oxyCODONE-acetaminophen (PERCOCET/ROXICET) 5-325 MG tablet Take 1-2 tablets by mouth every 6 (six) hours as needed for severe pain. 09/07/15  Yes Horton, Barbette Hair, MD  trimethoprim-polymyxin b (POLYTRIM) ophthalmic solution Apply 1-2 drops to affected eye QID x 5 days. 06/29/17  Yes Brunetta Jeans, PA-C     Positive ROS: negative  All other systems have been reviewed and were otherwise negative with the exception of those mentioned in the HPI and as above.  Physical Exam: Constitutional: Alert, well-appearing, no acute distress Ears: External ears without lesions or tenderness. Right ear canal is full of wax; left ear canal clear. After removal, ear  canals are clear bilaterally with intact, clear TMs. Tuning forks reveal symmetric hearing bilaterally. Nasal: External nose without lesions. Septum midline. Mild rhinitis but clear nasal passages. Middle meatus clear bilaterally without purulent discharge, polyps or masses. Oral: Lips and gums without lesions. Tongue and palate mucosa without lesions. Posterior oropharynx clear. Neck: No palpable adenopathy or masses Respiratory: Breathing comfortably  Skin: No facial/neck lesions or rash noted.  Cerumen impaction removal  Date/Time: 01/28/2019 9:05 AM Performed by: Frutoso Schatz, Christin M, PA-C Authorized by: Drema Halon, MD   Consent:    Consent obtained:  Verbal   Consent given by:  Patient    Risks discussed:  Pain and bleeding Procedure details:    Location:  R ear   Procedure type: suction and forceps   Post-procedure details:    Inspection:  TM intact and canal normal   Hearing quality:  Improved   Patient tolerance of procedure:  Tolerated well, no immediate complications    Assessment: Maxillary sinus irregularity on dental scan, likely benign process Cerumen impaction, right ear  Plan: Reviewed scans with patient. He has a calcification in his right maxillary sinus that may represent an ectopic tooth, calcification or cystic process. At this time, no further diagnostic studies or therapy required. Recommend he follow up if he gets sinus pain or pressure on that side or mucopurulent discharge. Otherwise he can follow up PRN.  Christin Hoffstadt, PA-C  I have personally seen and examined this patient. I agree with the assessment and plan as outlined above. Narda Bonds, MD

## 2019-07-29 DIAGNOSIS — Z1159 Encounter for screening for other viral diseases: Secondary | ICD-10-CM | POA: Diagnosis not present

## 2019-07-29 DIAGNOSIS — Z0000 Encounter for general adult medical examination without abnormal findings: Secondary | ICD-10-CM | POA: Diagnosis not present

## 2019-07-29 DIAGNOSIS — E785 Hyperlipidemia, unspecified: Secondary | ICD-10-CM | POA: Diagnosis not present

## 2019-07-30 DIAGNOSIS — E785 Hyperlipidemia, unspecified: Secondary | ICD-10-CM | POA: Diagnosis not present

## 2019-07-30 DIAGNOSIS — Z1159 Encounter for screening for other viral diseases: Secondary | ICD-10-CM | POA: Diagnosis not present

## 2019-07-30 DIAGNOSIS — Z0000 Encounter for general adult medical examination without abnormal findings: Secondary | ICD-10-CM | POA: Diagnosis not present

## 2020-05-15 ENCOUNTER — Encounter: Payer: Self-pay | Admitting: Family

## 2020-05-15 ENCOUNTER — Telehealth: Payer: 59 | Admitting: Family

## 2020-05-15 DIAGNOSIS — B029 Zoster without complications: Secondary | ICD-10-CM | POA: Diagnosis not present

## 2020-05-15 MED ORDER — GABAPENTIN 300 MG PO CAPS: 300.0000 mg | ORAL_CAPSULE | Freq: Two times a day (BID) | ORAL | 0 refills | Status: DC

## 2020-05-15 MED ORDER — VALACYCLOVIR HCL 1 G PO TABS: 1000.0000 mg | ORAL_TABLET | Freq: Three times a day (TID) | ORAL | 0 refills | Status: DC

## 2020-05-15 NOTE — Progress Notes (Signed)
   Virtual Visit via telephone Note Due to COVID-19 pandemic this visit was conducted virtually. This visit type was conducted due to national recommendations for restrictions regarding the COVID-19 Pandemic (e.g. social distancing, sheltering in place) in an effort to limit this patient's exposure and mitigate transmission in our community. All issues noted in this document were discussed and addressed.  A physical exam was not performed with this format.  I connected with Joshua Randolph on 05/15/20 at 9:03 pm  by video and verified that I am speaking with the correct person using two identifiers. Joshua Randolph is currently located at home and no one  is currently with him  during visit. The provider, Jannifer Rodney, FNP is located in their office at time of visit.  I discussed the limitations, risks, security and privacy concerns of performing an evaluation and management service by telephone and the availability of in person appointments. I also discussed with the patient that there may be a patient responsible charge related to this service. The patient expressed understanding and agreed to proceed.   History and Present Illness:  HPI  Pt complaining of burning,tingling pain that goes from his right eyebrow to the crown of his head that started yesterday. This morning he has three "whelps" above his eye. He reports his pain is a burning stinging pain of 3-4 out 10. He has taken motrin with mild relief.   Review of Systems  All other systems reviewed and are negative.    Observations/Objective: No SOB or distress noted, blister rash above right eye  Assessment and Plan: 1. Herpes zoster without complication Start Valtrex today Gabapentin as needed Motrin as needed Avoid immune compromised and pregnant women Pt will work from home until blisters are crusted over Call if eye becomes erythemas or painful - valACYclovir (VALTREX) 1000 MG tablet; Take 1 tablet (1,000 mg total) by mouth 3  (three) times daily.  Dispense: 21 tablet; Refill: 0 - gabapentin (NEURONTIN) 300 MG capsule; Take 1 capsule (300 mg total) by mouth 2 (two) times daily.  Dispense: 20 capsule; Refill: 0    I discussed the assessment and treatment plan with the patient. The patient was provided an opportunity to ask questions and all were answered. The patient agreed with the plan and demonstrated an understanding of the instructions.   The patient was advised to call back or seek an in-person evaluation if the symptoms worsen or if the condition fails to improve as anticipated.  The above assessment and management plan was discussed with the patient. The patient verbalized understanding of and has agreed to the management plan. Patient is aware to call the clinic if symptoms persist or worsen. Patient is aware when to return to the clinic for a follow-up visit. Patient educated on when it is appropriate to go to the emergency department.   Time call ended:  9:15 AM  I provided 12 minutes of non-face-to-face time during this encounter.    Jannifer Rodney, FNP

## 2020-05-17 ENCOUNTER — Telehealth: Payer: 59 | Admitting: Emergency Medicine

## 2020-05-17 DIAGNOSIS — B0239 Other herpes zoster eye disease: Secondary | ICD-10-CM | POA: Diagnosis not present

## 2020-05-17 DIAGNOSIS — B028 Zoster with other complications: Secondary | ICD-10-CM

## 2020-05-17 NOTE — Progress Notes (Signed)
With the virus approaching your eye, you need to be seen by an eye doctor or in a clinic that can examine your eye under a black light to see if you have virus affecting the eyeball itself.  If this is the case, you may need additional treatment.  You really need to have this exam performed.  As such, and e-visit won't be sufficient to assess this problem.   NOTE: If you entered your credit card information for this eVisit, you will not be charged. You may see a "hold" on your card for the $35 but that hold will drop off and you will not have a charge processed.   If you are having a true medical emergency please call 911.      For an urgent face to face visit, [place] has five urgent care centers for your convenience:     [facility] at [facility] Directions [phone] [address] [place], [place] [address] . 10 am - 6pm Monday - Friday    [facility]) Get Driving Directions [phone] [address] [place], [place] [address] . 10 am to 8 pm Monday-Friday . 12 pm to 8 pm [facility] Urgent Care at [facility] Get Driving Directions [phone] [address] [place], [place] [address] . 8 am to 8 pm Monday-Friday . 9 am to 6 pm Saturday . 11 am to 6 pm Sunday     [facility] Urgent Care at [facility] Get Driving Directions  [phone] [address] [place], [place] [address] . 8 am to 8 pm Monday-Friday . 8 am to 4 pm [facility]gent Care at [facility] Directions [phone] [address] [place], [place] [address] . 12 pm to 6 pm Monday-Friday      Your e-visit answers were reviewed by a board certified advanced clinical practitioner to complete your personal care plan.  Thank you for using e-Visits.    Approximately 5 minutes was used in reviewing the patient's chart,  questionnaire, prescribing medications, and documentation.

## 2020-05-26 DIAGNOSIS — B0239 Other herpes zoster eye disease: Secondary | ICD-10-CM | POA: Diagnosis not present

## 2020-08-25 DIAGNOSIS — Z0000 Encounter for general adult medical examination without abnormal findings: Secondary | ICD-10-CM | POA: Diagnosis not present

## 2020-08-25 DIAGNOSIS — Z79899 Other long term (current) drug therapy: Secondary | ICD-10-CM | POA: Diagnosis not present

## 2020-08-25 DIAGNOSIS — E785 Hyperlipidemia, unspecified: Secondary | ICD-10-CM | POA: Diagnosis not present

## 2020-09-01 DIAGNOSIS — E785 Hyperlipidemia, unspecified: Secondary | ICD-10-CM | POA: Diagnosis not present

## 2020-09-01 DIAGNOSIS — Z79899 Other long term (current) drug therapy: Secondary | ICD-10-CM | POA: Diagnosis not present

## 2020-09-01 DIAGNOSIS — Z23 Encounter for immunization: Secondary | ICD-10-CM | POA: Diagnosis not present

## 2020-09-01 DIAGNOSIS — Z8249 Family history of ischemic heart disease and other diseases of the circulatory system: Secondary | ICD-10-CM | POA: Diagnosis not present

## 2020-09-01 DIAGNOSIS — Z800 Family history of malignant neoplasm of digestive organs: Secondary | ICD-10-CM | POA: Diagnosis not present

## 2020-09-01 DIAGNOSIS — Z0000 Encounter for general adult medical examination without abnormal findings: Secondary | ICD-10-CM | POA: Diagnosis not present
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
## 2021-01-16 DIAGNOSIS — Z23 Encounter for immunization: Secondary | ICD-10-CM | POA: Diagnosis not present

## 2021-04-25 DIAGNOSIS — B078 Other viral warts: Secondary | ICD-10-CM | POA: Diagnosis not present

## 2021-04-25 DIAGNOSIS — A630 Anogenital (venereal) warts: Secondary | ICD-10-CM | POA: Diagnosis not present

## 2021-06-06 DIAGNOSIS — A630 Anogenital (venereal) warts: Secondary | ICD-10-CM | POA: Diagnosis not present

## 2021-06-06 DIAGNOSIS — B078 Other viral warts: Secondary | ICD-10-CM | POA: Diagnosis not present

## 2021-07-20 DIAGNOSIS — B078 Other viral warts: Secondary | ICD-10-CM | POA: Diagnosis not present

## 2021-07-20 DIAGNOSIS — A630 Anogenital (venereal) warts: Secondary | ICD-10-CM | POA: Diagnosis not present

## 2021-09-10 DIAGNOSIS — H60502 Unspecified acute noninfective otitis externa, left ear: Secondary | ICD-10-CM | POA: Diagnosis not present

## 2021-09-10 DIAGNOSIS — H6123 Impacted cerumen, bilateral: Secondary | ICD-10-CM | POA: Diagnosis not present

## 2021-10-24 DIAGNOSIS — Z13220 Encounter for screening for lipoid disorders: Secondary | ICD-10-CM | POA: Diagnosis not present

## 2021-10-24 DIAGNOSIS — E785 Hyperlipidemia, unspecified: Secondary | ICD-10-CM | POA: Diagnosis not present

## 2021-10-24 DIAGNOSIS — Z1329 Encounter for screening for other suspected endocrine disorder: Secondary | ICD-10-CM | POA: Diagnosis not present

## 2021-10-24 DIAGNOSIS — Z130 Encounter for screening for diseases of the blood and blood-forming organs and certain disorders involving the immune mechanism: Secondary | ICD-10-CM | POA: Diagnosis not present

## 2021-10-24 DIAGNOSIS — Z1389 Encounter for screening for other disorder: Secondary | ICD-10-CM | POA: Diagnosis not present

## 2021-10-24 DIAGNOSIS — Z13228 Encounter for screening for other metabolic disorders: Secondary | ICD-10-CM | POA: Diagnosis not present

## 2021-10-24 DIAGNOSIS — Z0001 Encounter for general adult medical examination with abnormal findings: Secondary | ICD-10-CM | POA: Diagnosis not present

## 2021-10-26 DIAGNOSIS — Z23 Encounter for immunization: Secondary | ICD-10-CM | POA: Diagnosis not present

## 2021-10-26 DIAGNOSIS — E785 Hyperlipidemia, unspecified: Secondary | ICD-10-CM | POA: Diagnosis not present

## 2021-10-26 DIAGNOSIS — Z1212 Encounter for screening for malignant neoplasm of rectum: Secondary | ICD-10-CM | POA: Diagnosis not present

## 2021-10-26 DIAGNOSIS — Z1211 Encounter for screening for malignant neoplasm of colon: Secondary | ICD-10-CM | POA: Diagnosis not present

## 2021-10-26 DIAGNOSIS — Z0000 Encounter for general adult medical examination without abnormal findings: Secondary | ICD-10-CM | POA: Diagnosis not present

## 2021-10-26 DIAGNOSIS — Z5320 Procedure and treatment not carried out because of patient's decision for unspecified reasons: Secondary | ICD-10-CM | POA: Diagnosis not present

## 2021-11-27 ENCOUNTER — Other Ambulatory Visit (HOSPITAL_BASED_OUTPATIENT_CLINIC_OR_DEPARTMENT_OTHER): Payer: Self-pay

## 2021-11-27 ENCOUNTER — Telehealth: Payer: 59 | Admitting: Physician Assistant

## 2021-11-27 DIAGNOSIS — J019 Acute sinusitis, unspecified: Secondary | ICD-10-CM

## 2021-11-27 DIAGNOSIS — B9689 Other specified bacterial agents as the cause of diseases classified elsewhere: Secondary | ICD-10-CM

## 2021-11-27 MED ORDER — FLUTICASONE PROPIONATE 50 MCG/ACT NA SUSP
2.0000 | Freq: Every day | NASAL | 0 refills | Status: DC
  Filled 2021-11-27: qty 16, 30d supply, fill #0

## 2021-11-27 MED ORDER — AMOXICILLIN-POT CLAVULANATE 875-125 MG PO TABS
1.0000 | ORAL_TABLET | Freq: Two times a day (BID) | ORAL | 0 refills | Status: DC
  Filled 2021-11-27: qty 14, 7d supply, fill #0

## 2021-11-27 NOTE — Progress Notes (Signed)
Virtual Visit Consent   Joshua Randolph, you are scheduled for a virtual visit with a [facility] provider today. Just as with appointments in the office, your consent must be obtained to participate. Your consent will be active for this visit and any virtual visit you may have with one of our providers in the next 365 days. If you have a MyChart account, a copy of this consent can be sent to you electronically.  As this is a virtual visit, video technology does not allow for your provider to perform a traditional examination. This may limit your provider's ability to fully assess your condition. If your provider identifies any concerns that need to be evaluated in person or the need to arrange testing (such as labs, EKG, etc.), we will make arrangements to do so. Although advances in technology are sophisticated, we cannot ensure that it will always work on either your end or our end. If the connection with a video visit is poor, the visit may have to be switched to a telephone visit. With either a video or telephone visit, we are not always able to ensure that we have a secure connection.  By engaging in this virtual visit, you consent to the provision of healthcare and authorize for your insurance to be billed (if applicable) for the services provided during this visit. Depending on your insurance coverage, you may receive a charge related to this service.  I need to obtain your verbal consent now. Are you willing to proceed with your visit today? Joshua Randolph has provided verbal consent on 11/27/2021 for a virtual visit (video or telephone). Piedad Climes, [place]  Date: 11/27/2021 8:25 AM  Virtual Visit via Video Note   I, Piedad Climes, connected with  Joshua Randolph  ([id number], 1970/06/26) on 11/27/21 at  8:15 AM EDT by a video-enabled telemedicine application and verified that I am speaking with the correct person using two identifiers.  Location: Patient: Virtual Visit Location  Patient: Mobile Provider: Virtual Visit Location Provider: Home Office   I discussed the limitations of evaluation and management by telemedicine and the availability of in person appointments. The patient expressed understanding and agreed to proceed.    History of Present Illness: Joshua Randolph is a 51 y.o. who identifies as a male who was assigned male at birth, and is being seen today for possible sinusitis. Notes symptoms starting about 10 days ago, initially as mild cold with low-grade fever which resolved after few days. Continued with head congestion, sinus pressure and cough, now with sinus and facial pain. Denies chest pain or SOB. Denies recurrence of fever.  HPI: HPI  Problems:  Patient Active Problem List   Diagnosis Date Noted   Triquetral chip fracture, left, closed, initial encounter 11/10/2018   HYPERLIPIDEMIA 11/02/2009   FEVER UNSPECIFIED 11/02/2009   HEADACHE 11/02/2009   UNSPECIFIED OTITIS MEDIA 08/04/2009   ACUTE SINUSITIS, UNSPECIFIED 08/04/2009    Allergies: No Known Allergies Medications:  Current Outpatient Medications:    amoxicillin-clavulanate (AUGMENTIN) 875-125 MG tablet, Take 1 tablet by mouth 2 (two) times daily., Disp: 14 tablet, Rfl: 0   fluticasone (FLONASE) 50 MCG/ACT nasal spray, Place 2 sprays into both nostrils daily., Disp: 16 g, Rfl: 0  Observations/Objective: Patient is well-developed, well-nourished in no acute distress.  Resting comfortably at home.  Head is normocephalic, atraumatic.  No labored breathing. Speech is clear and coherent with logical content.  Patient is alert and oriented at baseline.   Assessment and Plan: 1. Acute bacterial  sinusitis - fluticasone (FLONASE) 50 MCG/ACT nasal spray; Place 2 sprays into both nostrils daily.  Dispense: 16 g; Refill: 0 - amoxicillin-clavulanate (AUGMENTIN) 875-125 MG tablet; Take 1 tablet by mouth 2 (two) times daily.  Dispense: 14 tablet; Refill: 0  Rx Augmentin.  Increase fluids.   Rest.  Saline nasal spray.  Probiotic.  Mucinex as directed.  Humidifier in bedroom. Flonase per orders.  Call or return to clinic if symptoms are not improving.   Follow Up Instructions: I discussed the assessment and treatment plan with the patient. The patient was provided an opportunity to ask questions and all were answered. The patient agreed with the plan and demonstrated an understanding of the instructions.  A copy of instructions were sent to the patient via MyChart unless otherwise noted below.   The patient was advised to call back or seek an in-person evaluation if the symptoms worsen or if the condition fails to improve as anticipated.  Time:  I spent 8 minutes with the patient via telehealth technology discussing the above problems/concerns.    Leeanne Rio, PA-C

## 2021-11-27 NOTE — Patient Instructions (Signed)
Marlene Bast, thank you for joining Piedad Climes, PA-C for today's virtual visit.  While this provider is not your primary care provider (PCP), if your PCP is located in our provider database this encounter information will be shared with them immediately following your visit.  Consent: (Patient) Joshua Randolph provided verbal consent for this virtual visit at the beginning of the encounter.  Current Medications:  Current Outpatient Medications:    atorvastatin (LIPITOR) 10 MG tablet, Take 10 mg by mouth daily., Disp: , Rfl:    fluticasone (FLONASE) 50 MCG/ACT nasal spray, Place into both nostrils daily., Disp: , Rfl:    gabapentin (NEURONTIN) 300 MG capsule, Take 1 capsule (300 mg total) by mouth 2 (two) times daily., Disp: 20 capsule, Rfl: 0   ondansetron (ZOFRAN ODT) 4 MG disintegrating tablet, Take 1 tablet (4 mg total) by mouth every 8 (eight) hours as needed for nausea or vomiting., Disp: 20 tablet, Rfl: 0   oxyCODONE-acetaminophen (PERCOCET/ROXICET) 5-325 MG tablet, Take 1-2 tablets by mouth every 6 (six) hours as needed for severe pain., Disp: 10 tablet, Rfl: 0   trimethoprim-polymyxin b (POLYTRIM) ophthalmic solution, Apply 1-2 drops to affected eye QID x 5 days., Disp: 10 mL, Rfl: 0   valACYclovir (VALTREX) 1000 MG tablet, Take 1 tablet (1,000 mg total) by mouth 3 (three) times daily., Disp: 21 tablet, Rfl: 0   Medications ordered in this encounter:  No orders of the defined types were placed in this encounter.    *If you need refills on other medications prior to your next appointment, please contact your pharmacy*  Follow-Up: Call back or seek an in-person evaluation if the symptoms worsen or if the condition fails to improve as anticipated.  Other Instructions Please take antibiotic as directed.  Increase fluid intake.  Use Saline nasal spray.  Take a daily multivitamin. Use the Flonase as directed. Consider OTC Tylenol Cold and Sinus or Mucinex Sinus Max.  Place a  humidifier in the bedroom.  Please call or return clinic if symptoms are not improving.  Sinusitis Sinusitis is redness, soreness, and swelling (inflammation) of the paranasal sinuses. Paranasal sinuses are air pockets within the bones of your face (beneath the eyes, the middle of the forehead, or above the eyes). In healthy paranasal sinuses, mucus is able to drain out, and air is able to circulate through them by way of your nose. However, when your paranasal sinuses are inflamed, mucus and air can become trapped. This can allow bacteria and other germs to grow and cause infection. Sinusitis can develop quickly and last only a short time (acute) or continue over a long period (chronic). Sinusitis that lasts for more than 12 weeks is considered chronic.  CAUSES  Causes of sinusitis include: Allergies. Structural abnormalities, such as displacement of the cartilage that separates your nostrils (deviated septum), which can decrease the air flow through your nose and sinuses and affect sinus drainage. Functional abnormalities, such as when the small hairs (cilia) that line your sinuses and help remove mucus do not work properly or are not present. SYMPTOMS  Symptoms of acute and chronic sinusitis are the same. The primary symptoms are pain and pressure around the affected sinuses. Other symptoms include: Upper toothache. Earache. Headache. Bad breath. Decreased sense of smell and taste. A cough, which worsens when you are lying flat. Fatigue. Fever. Thick drainage from your nose, which often is green and may contain pus (purulent). Swelling and warmth over the affected sinuses. DIAGNOSIS  Your caregiver will perform  a physical exam. During the exam, your caregiver may: Look in your nose for signs of abnormal growths in your nostrils (nasal polyps). Tap over the affected sinus to check for signs of infection. View the inside of your sinuses (endoscopy) with a special imaging device with a  light attached (endoscope), which is inserted into your sinuses. If your caregiver suspects that you have chronic sinusitis, one or more of the following tests may be recommended: Allergy tests. Nasal culture A sample of mucus is taken from your nose and sent to a lab and screened for bacteria. Nasal cytology A sample of mucus is taken from your nose and examined by your caregiver to determine if your sinusitis is related to an allergy. TREATMENT  Most cases of acute sinusitis are related to a viral infection and will resolve on their own within 10 days. Sometimes medicines are prescribed to help relieve symptoms (pain medicine, decongestants, nasal steroid sprays, or saline sprays).  However, for sinusitis related to a bacterial infection, your caregiver will prescribe antibiotic medicines. These are medicines that will help kill the bacteria causing the infection.  Rarely, sinusitis is caused by a fungal infection. In theses cases, your caregiver will prescribe antifungal medicine. For some cases of chronic sinusitis, surgery is needed. Generally, these are cases in which sinusitis recurs more than 3 times per year, despite other treatments. HOME CARE INSTRUCTIONS  Drink plenty of water. Water helps thin the mucus so your sinuses can drain more easily. Use a humidifier. Inhale steam 3 to 4 times a day (for example, sit in the bathroom with the shower running). Apply a warm, moist washcloth to your face 3 to 4 times a day, or as directed by your caregiver. Use saline nasal sprays to help moisten and clean your sinuses. Take over-the-counter or prescription medicines for pain, discomfort, or fever only as directed by your caregiver. SEEK IMMEDIATE MEDICAL CARE IF: You have increasing pain or severe headaches. You have nausea, vomiting, or drowsiness. You have swelling around your face. You have vision problems. You have a stiff neck. You have difficulty breathing. MAKE SURE YOU:  Understand  these instructions. Will watch your condition. Will get help right away if you are not doing well or get worse. Document Released: 02/26/2005 Document Revised: 05/21/2011 Document Reviewed: 03/13/2011 [facility] Patient Information 2014 [place], [place].    If you have been instructed to have an in-person evaluation today at a local Urgent Care facility, please use the link below. It will take you to a list of all of our available [facility], including address, phone number and hours of operation. Please do not delay care.  [facility]  If you or a family member do not have a primary care provider, use the link below to schedule a visit and establish care. When you choose a [facility] primary care physician or advanced practice provider, you gain a long-term partner in health. Find a Primary Care Provider  Learn more about 's in-office and virtual care options: [facility]

## 2022-01-29 ENCOUNTER — Telehealth: Payer: 59 | Admitting: Nurse Practitioner

## 2022-01-29 ENCOUNTER — Other Ambulatory Visit (HOSPITAL_BASED_OUTPATIENT_CLINIC_OR_DEPARTMENT_OTHER): Payer: Self-pay

## 2022-01-29 ENCOUNTER — Telehealth: Payer: 59 | Admitting: Physician Assistant

## 2022-01-29 DIAGNOSIS — R42 Dizziness and giddiness: Secondary | ICD-10-CM

## 2022-01-29 DIAGNOSIS — H8101 Meniere's disease, right ear: Secondary | ICD-10-CM

## 2022-01-29 MED ORDER — MECLIZINE HCL 25 MG PO TABS
25.0000 mg | ORAL_TABLET | Freq: Three times a day (TID) | ORAL | 0 refills | Status: DC | PRN
  Filled 2022-01-29: qty 30, 10d supply, fill #0

## 2022-01-29 NOTE — Progress Notes (Signed)
Virtual Visit Consent   Joshua Randolph, you are scheduled for a virtual visit with a [facility] provider today. Just as with appointments in the office, your consent must be obtained to participate. Your consent will be active for this visit and any virtual visit you may have with one of our providers in the next 365 days. If you have a MyChart account, a copy of this consent can be sent to you electronically.  As this is a virtual visit, video technology does not allow for your provider to perform a traditional examination. This may limit your provider's ability to fully assess your condition. If your provider identifies any concerns that need to be evaluated in person or the need to arrange testing (such as labs, EKG, etc.), we will make arrangements to do so. Although advances in technology are sophisticated, we cannot ensure that it will always work on either your end or our end. If the connection with a video visit is poor, the visit may have to be switched to a telephone visit. With either a video or telephone visit, we are not always able to ensure that we have a secure connection.  By engaging in this virtual visit, you consent to the provision of healthcare and authorize for your insurance to be billed (if applicable) for the services provided during this visit. Depending on your insurance coverage, you may receive a charge related to this service.  I need to obtain your verbal consent now. Are you willing to proceed with your visit today? Sam Overbeck has provided verbal consent on 01/29/2022 for a virtual visit (video or telephone). Margaretann Loveless, PA-C  Date: 01/29/2022 4:11 PM  Virtual Visit via Video Note   I, Margaretann Loveless, connected with  Joshua Randolph  ([id number], 1970/09/13) on 01/29/22 at  4:00 PM EST by a video-enabled telemedicine application and verified that I am speaking with the correct person using two identifiers.  Location: Patient: Virtual Visit Location  Patient: Home Provider: Virtual Visit Location Provider: Home Office   I discussed the limitations of evaluation and management by telemedicine and the availability of in person appointments. The patient expressed understanding and agreed to proceed.    History of Present Illness: Joshua Randolph is a 51 y.o. who identifies as a male who was assigned male at birth, and is being seen today for recurrent Vertigo. Has long standing history of vertigo and Meniere's in his right ear. Notes he has went years without a recent flare. Over the last couple weeks have had more episodes of vertigo. Reports one a couple weeks ago he was able to perform the Epley Maneuver and that helped some. Then last week had an episode so bad he was unable to move off the couch. Could feel his nystagmus and had head pressure, neck stiffness, and dry heaves with that episode. He does have Meclizine and has used for severe episodes, but sometimes they have to just run their course. He is requesting a referral to the [facility], Ammie Ferrier, NP as he has seen her in the past for this issue and she focuses on vertigo.    Problems:  Patient Active Problem List   Diagnosis Date Noted   Triquetral chip fracture, left, closed, initial encounter 11/10/2018   HYPERLIPIDEMIA 11/02/2009   FEVER UNSPECIFIED 11/02/2009   HEADACHE 11/02/2009   UNSPECIFIED OTITIS MEDIA 08/04/2009   ACUTE SINUSITIS, UNSPECIFIED 08/04/2009    Allergies: No Known Allergies Medications:  Current Outpatient Medications:  amoxicillin-clavulanate (AUGMENTIN) 875-125 MG tablet, Take 1 tablet by mouth 2 (two) times daily., Disp: 14 tablet, Rfl: 0   fluticasone (FLONASE) 50 MCG/ACT nasal spray, Place 2 sprays into both nostrils daily., Disp: 16 g, Rfl: 0   meclizine (ANTIVERT) 25 MG tablet, Take 1 tablet (25 mg total) by mouth 3 (three) times daily as needed for dizziness., Disp: 30 tablet, Rfl: 0  Observations/Objective: Patient is  well-developed, well-nourished in no acute distress.  Resting comfortably at home.  Head is normocephalic, atraumatic.  No labored breathing.  Speech is clear and coherent with logical content.  Patient is alert and oriented at baseline.    Assessment and Plan: 1. Vertigo - Ambulatory referral to Audiology  2. Meniere's disease of right ear - Ambulatory referral to Audiology  - Advised patient we do not normally place orders due to not having sufficient ancillary staff to work referral queues and obtain insurance approval.  - He voiced understanding and still desired referral - Referral placed and advised to follow up with their office on the referral as we had no one that would be working it, so their office would have to pick it up to work it - [employer] as needed - Push fluids - Seek in person evaluation if symptoms worsen before can be seen with Audiologist  Follow Up Instructions: I discussed the assessment and treatment plan with the patient. The patient was provided an opportunity to ask questions and all were answered. The patient agreed with the plan and demonstrated an understanding of the instructions.  A copy of instructions were sent to the patient via MyChart unless otherwise noted below.    The patient was advised to call back or seek an in-person evaluation if the symptoms worsen or if the condition fails to improve as anticipated.  Time:  I spent 13 minutes with the patient via telehealth technology discussing the above problems/concerns.    Margaretann Loveless, PA-C

## 2022-01-29 NOTE — Patient Instructions (Signed)
Marlene Bast, thank you for joining Margaretann Loveless, PA-C for today's virtual visit.  While this provider is not your primary care provider (PCP), if your PCP is located in our provider database this encounter information will be shared with them immediately following your visit.   A [facility] MyChart account gives you access to today's visit and all your visits, tests, and labs performed at [facility] " click here if you don't have a [facility] MyChart account or go to [email or web address]  Consent: (Patient) Aleksi Brummet provided verbal consent for this virtual visit at the beginning of the encounter.  Current Medications:  Current Outpatient Medications:    amoxicillin-clavulanate (AUGMENTIN) 875-125 MG tablet, Take 1 tablet by mouth 2 (two) times daily., Disp: 14 tablet, Rfl: 0   fluticasone (FLONASE) 50 MCG/ACT nasal spray, Place 2 sprays into both nostrils daily., Disp: 16 g, Rfl: 0   meclizine (ANTIVERT) 25 MG tablet, Take 1 tablet (25 mg total) by mouth 3 (three) times daily as needed for dizziness., Disp: 30 tablet, Rfl: 0   Medications ordered in this encounter:  No orders of the defined types were placed in this encounter.    *If you need refills on other medications prior to your next appointment, please contact your pharmacy*  Follow-Up: Call back or seek an in-person evaluation if the symptoms worsen or if the condition fails to improve as anticipated.   Virtual Care [phone]  Other Instructions  Brandt-Daroff Exercise for Vertigo  The Brandt-Daroff exercise is one of several exercises that can speed up the compensation process and end the symptoms of vertigo. It often is prescribed for people who have benign paroxysmal positional vertigo (BPPV) and sometimes for labyrinthitis. These exercises won't cure these conditions. But over time they can reduce symptoms of vertigo.  People who use this exercise usually are told to do  several repetitions of the exercise at least twice a day.  How It Is Done To do the Brandt-Daroff exercise:  Start in an upright, seated position. Move into the lying position on one side with your nose pointed up at about a 45-degree angle. Remain in this position for about 30 seconds (or until the vertigo subsides, whichever is longer), then move back to the seated position. Repeat steps 2 and 3 on the other side. What To Expect Symptoms sometimes suddenly go away during an exercise period. More often, improvement occurs gradually over a period of weeks or months.  Why It Is Done The Brandt-Daroff exercise and other similar exercises are used to treat BPPV. These exercises are sometimes used to treat labyrinthitis or vestibular neuritis.  How Well It Works These exercises can help your body get used to the confusing signals that are causing your vertigo. This may help you get over your vertigo sooner.  The Brandt-Daroff exercise does not help relieve the symptoms of benign paroxysmal positional vertigo (BPPV) as well as the Semont maneuver or the Epley maneuver.footnote1  Risks There are no risks in doing these exercises. To avoid hitting your head or developing minor neck injuries, be careful not to lie down too quickly.  References Citations Fife TD, et al. (2008). Practice parameter: Therapies for benign paroxysmal positional vertigo (an evidence-based review). Report of the Quality Standards Subcommittee of the American Academy of Neurology. Neurology, 70(22): [phone]. Current as of: Jul 13, 2020   Mnire's Disease  Mnire's disease is an inner ear disorder. It causes recurrent attacks of a spinning sensation (vertigo), and ringing in  the ear (tinnitus). It also causes hearing loss and a feeling of fullness or pressure in the ear. It typically affects one ear but can affect both. This is a lifelong condition, and it may get worse over time. There are treatment options to help  manage the symptoms of Mnire's disease. What are the causes? This condition is caused by having too much of the fluid that is in the inner ear (endolymph). When fluid builds up in the inner ear, it affects the nerves that control balance and hearing. The reason for the fluid buildup is not known. Possible causes include: Allergies. An abnormal reaction of the body's defense system (autoimmune disease). Viral infection of the inner ear. Head injury. What increases the risk? The following factors may make you more likely to develop this condition: Being between 74 and 37 years old. Having a family history of Mnire's disease. Having a history of autoimmune disease. Having an injury to the head (head trauma). What are the signs or symptoms? Symptoms of this condition include: Fullness and pressure in your ear. Roaring or ringing in your ear (tinnitus). Vertigo and loss of balance. Decreased hearing. Nausea and vomiting. This is rare. Symptoms of this condition can come and go and may last for up to 4 or more hours at a time. Symptoms usually start in one ear. They may become more frequent and eventually involve both ears. How is this diagnosed? This condition is diagnosed based on a physical exam and tests. Tests may include: A hearing test (audiogram). An electronystagmogram (ENG). This tests your balance nerve (vestibular nerve). Imaging studies of your inner ear and hearing nerve, such as an MRI. Other balance tests, such as rotational or balance platform tests. How is this treated? There is no cure for this condition, but treatment can help to manage your symptoms. Treatment may include: A low-salt (low-sodium) diet. This may help to reduce fluid in the body and relieve symptoms. Oral or injected medicines to reduce or control: Vertigo. Nausea. Fluid retention. Use of an air pressure pulse generator. This is a machine that sends small pressure pulses into your ear  canal. Injections through the ear drum (tympanic membrane) to control vertigo symptoms. Hearing aids. Inner ear surgery. This is rare. Follow these instructions at home: Eating and drinking Avoid caffeine. Do not drink alcohol. Drink enough fluid to keep your urine pale yellow. Limit the sodium in your diet as told by your health care provider. This is usually no more than 1,500-2,000 mg per day. Check ingredients and nutrition facts on packaged foods and beverages. General instructions Take over-the-counter and prescription medicines only as told by your health care provider. Do not drive if you have vertigo or dizziness. Do not use any products that contain nicotine or tobacco. These products include cigarettes, chewing tobacco, and vaping devices, such as e-cigarettes. If you need help quitting, ask your health care provider. Keep all follow-up visits. This is important. Where to find more information To find more information, advice, and guidance, please see these online sites: American Academy of Otolaryngology-Head and Neck Surgery Foundation: www.enthealth.org [employer] on Deafness and Other Communication Disorders: PoolDevices.com.pt [employer]: www.american-hearing.org Contact a health care provider if: You have symptoms that last longer than 4 hours. You have new or worse symptoms. Get help right away if: You have been vomiting for 24 hours. You cannot keep fluids down. You have chest pain or trouble breathing. These symptoms may represent a serious problem that is an emergency.  Do not wait to see if the symptoms will go away. Get medical help right away. Call your local emergency services (911 in the [place] Do not drive yourself to the hospital. Summary Mnire's disease is an inner ear disorder. This condition causes recurrent attacks of a spinning sensation (vertigo), ringing in the ear (tinnitus), hearing loss, and ear fullness. Symptoms  of this condition can come and go and may last for up to 4 or more hours at a time. Mnire's disease can be treated with a low-sodium diet, medicines, and sometimes, surgery. This information is not intended to replace advice given to you by your health care provider. Make sure you discuss any questions you have with your health care provider. Document Revised: 02/01/2020 Document Reviewed: 02/01/2020 Elsevier Patient Education  2023 Elsevier Inc.    If you have been instructed to have an in-person evaluation today at a local Urgent Care facility, please use the link below. It will take you to a list of all of our available [facility], including address, phone number and hours of operation. Please do not delay care.  [facility]  If you or a family member do not have a primary care provider, use the link below to schedule a visit and establish care. When you choose a [facility] primary care physician or advanced practice provider, you gain a long-term partner in health. Find a Primary Care Provider  Learn more about [facility]'s in-office and virtual care options: [facility] - Get Care Now

## 2022-01-29 NOTE — Progress Notes (Signed)
Joshua Randolph,  We are sorry to hear you are experiencing this. At this time our e-visits cannot provide referrals. I would suggest reaching out to your primary care to help you with that. While you are waiting for your referral here are some tips and medication that can help you manage this episode:    We are sorry that you are not feeling well. Here is how we plan to help!  Based on what you have shared with me it looks like you have symptoms of motion sickness.  I have prescribed a medication that will help prevent or alleviate your symptoms:  Meclizine 25mg  by mouth three times per day as needed for nausea/motion sickness   Prevention:  You might feel better if you keep your eyes focused on outside while you are in motion. For example, if you are in a car, sit in the front and look in the direction you are moving; if you are on a boat, stay on the deck and look to the horizon. This helps make what you see match the movement you are feeling, and so you are less likely to feel sick.  You should also avoid reading, watching a movie, texting or reading messages, or looking at things close to you inside the vehicle you are riding in.  Use the seat head rest. Lean your head against the back of the seat or head rest when traveling in vehicles with seats to minimize head movements.  On a ship: When making your reservations, choose a cabin in the middle of the ship and near the waterline. When on board, go up on deck and focus on the horizon.  In an airplane: Request a window seat and look out the window. A seat over the front edge of the wing is the most preferable spot (the degree of motion is the lowest here). Direct the air vent to blow cool air on your face.  On a train: Always face forward and sit near a window.  In a vehicle: Sit in the front seat; if you are the passenger, look at the scenery in the distance. For some people, driving the vehicle (rather than being a passenger) is an  instant remedy.  Avoid others who have become nauseous with motion sickness. Seeing and smelling others who have motion sickness may cause you to become sick.  GET HELP RIGHT AWAY IF:  Your symptoms do not improve or worsen within 2 days after treatment.  You cannot keep down fluids after trying the medication.  Other associated symptoms such as severe headache, visual field changes, fever, or intractable nausea and vomiting.  MAKE SURE YOU:  Understand these instructions. Will watch your condition. Will get help right away if you are not doing well or get worse.  Thank you for choosing an e-visit.  Your e-visit answers were reviewed by a board certified advanced clinical practitioner to complete your personal care plan. Depending upon the condition, your plan could have included both over the counter or prescription medications.  Please review your pharmacy choice. Be sure that the pharmacy you have chosen is open so that you can pick up your prescription now.  If there is a problem you may message your provider in [place] to have the prescription routed to another pharmacy.  Your safety is important to [place]. If you have drug allergies check your prescription carefully.   For the next 24 hours, you can use MyChart to ask questions about today's visit, request a non-urgent call back,  or ask for a work or school excuse from your e-visit provider.  You will get an e-mail in the next two days asking about your experience. I hope that your e-visit has been valuable and will speed your recovery.   References or for more information: https://cross.com/ https://my.https://rowe.info/ https://www.uptodate.com   I spent approximately 5 minutes reviewing the patient's history, current symptoms and coordinating their plan of care today.

## 2022-02-09 ENCOUNTER — Other Ambulatory Visit (HOSPITAL_BASED_OUTPATIENT_CLINIC_OR_DEPARTMENT_OTHER): Payer: Self-pay

## 2022-02-19 ENCOUNTER — Ambulatory Visit: Payer: 59 | Attending: Audiologist | Admitting: Audiologist

## 2022-02-19 DIAGNOSIS — H9041 Sensorineural hearing loss, unilateral, right ear, with unrestricted hearing on the contralateral side: Secondary | ICD-10-CM

## 2022-02-19 DIAGNOSIS — H8101 Meniere's disease, right ear: Secondary | ICD-10-CM | POA: Diagnosis not present

## 2022-02-19 DIAGNOSIS — H9311 Tinnitus, right ear: Secondary | ICD-10-CM

## 2022-02-19 NOTE — Procedures (Signed)
Outpatient Audiology and [facility] [address] [place], [place]  [address] [phone]  AUDIOLOGICAL  EVALUATION  NAME: Joshua Randolph    DOB:   23-Oct-1970      MRN: [id number]                                                                                     DATE: 02/19/2022     REFERENT: Joshua Jacobson, MD STATUS: Outpatient DIAGNOSIS: Menire's Disease Right Ear    History: Joshua Randolph was seen for an audiological evaluation.  Joshua Randolph is receiving a hearing evaluation due  for tinnitus and hearing loss in his right ear. Over ten years ago Joshua Randolph had a severe case of vertigo. He was on the floor crawling. The vertigo episode lasted a few hours. Joshua Randolph had a few episodes since. His symptoms seem to be worse after eating lots of sodium. His last one was in July 2023. Joshua Randolph has difficulty hearing in the right ear ever since the vertigo started. He feels he cannot hear at all from that ear. He has a constant high pitched ringing sound in his right ear only. He was previous seen by Otolaryngology when this first started and diagnosed with Meniere's in the right ear. 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 The tinnitus is louder afterwards. 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 He wonders if this caused the damage to his hearing. No other relevant case history reported.   Evaluation:  Otoscopy showed a clear view of the tympanic membranes, bilaterally Tympanometry results were consistent with normal middle ear function, bilaterally   Audiometric testing was completed using conventional audiometry with insert transducer. Speech Recognition Thresholds were  55dB in the right ear with masking and 5dB in the left ear. Word Recognition was performed 40dB SL, scored 56% in the right ear and 100% in the left ear. Pure tone thresholds show moderate sensorineural hearing loss  in the right ear and normal hearing in the left ear.  Tinnitus matched to 4kHz pure tone at 19dB SL, matched in left ear to right. Rated a 9 on scale of 1-10 with 10 perfect match. Positive for partial residual inhibition with 30 seconds of NBN at 10dB SL. Shows good candidacy for masking therapy.   Results:  The test results were reviewed with Joshua Randolph. He has a moderate hearing loss in the right ear only. Joshua Randolph's tinnitus matched to a high pitched tone and decreased with masking at that pitch. Recommend he try a hearing aid to help him hear and decrease tinnitus in the right ear. Joshua Randolph is the local provider that is an expert in fitting a hearing aid for tinnitus relief. Joshua Randolph was given the list of local providers.   Recommendations: Amplification is necessary for the right ear. Hearing aids can be purchased from a variety of locations. See provided list for locations in the [place] area.  Referral to ENT Physician necessary due to unilateral Meniere's and need for medical clearance for hearing aid use.  Talk to Otolaryngologist about limiting sodium and other ways to help decrease Meniere's  symptoms.  Use of Bose Sleep Buds or the MyNoise App is recommended to mask tinnitus when needed.  Use of masker should be at night and when in quiet environments where the tinnitus is loud or when around triggering sound. Masker should be played at lowest level possible that provides relief from tinnitus.  Earplugs only recommended when around continuous exposure to truly loud sound, such as a concert. Not recommended for every day use.    43 minutes spent testing and counseling on results.   Joshua Randolph  Audiologist, Au.D., CCC-A 02/19/2022  3:29 PM  Cc: Joshua Jacobson, MD

## 2022-04-06 ENCOUNTER — Other Ambulatory Visit (HOSPITAL_BASED_OUTPATIENT_CLINIC_OR_DEPARTMENT_OTHER): Payer: Commercial Managed Care - PPO

## 2022-04-06 ENCOUNTER — Emergency Department (HOSPITAL_BASED_OUTPATIENT_CLINIC_OR_DEPARTMENT_OTHER)
Admission: EM | Admit: 2022-04-06 | Discharge: 2022-04-06 | Disposition: A | Discharge status: Home / Self Care | Payer: Commercial Managed Care - PPO | Attending: Emergency Medicine | Admitting: Emergency Medicine

## 2022-04-06 ENCOUNTER — Emergency Department (HOSPITAL_COMMUNITY): Payer: Commercial Managed Care - PPO

## 2022-04-06 ENCOUNTER — Emergency Department (HOSPITAL_BASED_OUTPATIENT_CLINIC_OR_DEPARTMENT_OTHER): Payer: Commercial Managed Care - PPO

## 2022-04-06 ENCOUNTER — Other Ambulatory Visit: Payer: Self-pay

## 2022-04-06 DIAGNOSIS — R202 Paresthesia of skin: Secondary | ICD-10-CM | POA: Diagnosis not present

## 2022-04-06 DIAGNOSIS — R42 Dizziness and giddiness: Secondary | ICD-10-CM

## 2022-04-06 DIAGNOSIS — R29818 Other symptoms and signs involving the nervous system: Secondary | ICD-10-CM | POA: Diagnosis not present

## 2022-04-06 DIAGNOSIS — R519 Headache, unspecified: Secondary | ICD-10-CM | POA: Diagnosis not present

## 2022-04-06 DIAGNOSIS — R110 Nausea: Secondary | ICD-10-CM | POA: Insufficient documentation

## 2022-04-06 DIAGNOSIS — R270 Ataxia, unspecified: Secondary | ICD-10-CM

## 2022-04-06 LAB — URINALYSIS, ROUTINE W REFLEX MICROSCOPIC
Bacteria, UA: NONE SEEN
Bilirubin Urine: NEGATIVE
Glucose, UA: NEGATIVE mg/dL
Ketones, ur: NEGATIVE mg/dL
Leukocytes,Ua: NEGATIVE
Nitrite: NEGATIVE
Protein, ur: NEGATIVE mg/dL
Specific Gravity, Urine: 1.012 (ref 1.005–1.030)
pH: 6.5 (ref 5.0–8.0)

## 2022-04-06 LAB — CBG MONITORING, ED: Glucose-Capillary: 116 mg/dL — ABNORMAL HIGH (ref 70–99)

## 2022-04-06 LAB — BASIC METABOLIC PANEL
Anion gap: 9 (ref 5–15)
BUN: 19 mg/dL (ref 6–20)
CO2: 26 mmol/L (ref 22–32)
Calcium: 9.4 mg/dL (ref 8.9–10.3)
Chloride: 102 mmol/L (ref 98–111)
Creatinine, Ser: 1.1 mg/dL (ref 0.61–1.24)
GFR, Estimated: >60 mL/min (ref >60)
Glucose, Bld: 113 mg/dL — ABNORMAL HIGH (ref 70–99)
Potassium: 3.9 mmol/L (ref 3.5–5.1)
Sodium: 137 mmol/L (ref 135–145)

## 2022-04-06 LAB — CBC
HCT: 46.6 % (ref 39.0–52.0)
Hemoglobin: 16.3 g/dL (ref 13.0–17.0)
MCH: 31.8 pg (ref 26.0–34.0)
MCHC: 35 g/dL (ref 30.0–36.0)
MCV: 91 fL (ref 80.0–100.0)
Platelets: 231 10*3/uL (ref 150–400)
RBC: 5.12 MIL/uL (ref 4.22–5.81)
RDW: 12.4 % (ref 11.5–15.5)
WBC: 7.3 10*3/uL (ref 4.0–10.5)
nRBC: 0 % (ref 0.0–0.2)

## 2022-04-06 MED ORDER — DIAZEPAM 2 MG PO TABS
2.0000 mg | ORAL_TABLET | Freq: Once | ORAL | Status: AC
  Administered 2022-04-06: 2 mg via ORAL
  Filled 2022-04-06: qty 1

## 2022-04-06 MED ORDER — MECLIZINE HCL 25 MG PO TABS
25.0000 mg | ORAL_TABLET | Freq: Once | ORAL | Status: AC
  Administered 2022-04-06: 25 mg via ORAL
  Filled 2022-04-06: qty 1

## 2022-04-06 MED ORDER — DIAZEPAM 2 MG PO TABS: 2.0000 mg | ORAL_TABLET | Freq: Four times a day (QID) | ORAL | 0 refills | Status: DC | PRN

## 2022-04-06 MED ORDER — IOHEXOL 350 MG/ML SOLN
100.0000 mL | Freq: Once | INTRAVENOUS | Status: AC | PRN
  Administered 2022-04-06: 75 mL via INTRAVENOUS

## 2022-04-06 MED ORDER — GADOBUTROL 1 MMOL/ML IV SOLN
10.0000 mL | Freq: Once | INTRAVENOUS | Status: AC | PRN
  Administered 2022-04-06: 10 mL via INTRAVENOUS

## 2022-04-06 NOTE — ED Triage Notes (Signed)
Pt reports multiple episodes of dizziness and tingling in bilateral legs, starting midline in neck and radiating down.  Episode duration varies. + nausea, no SOB or vomiting, no CP.  Pt reports today's episode began around 1030am and he feels tingling in bilateral legs and mild head discomfort.  Previous episodes resolved spontaneously without any medication treatment.  Pt took 1 12hour sudafed for mild congestion this morning.

## 2022-04-06 NOTE — ED Notes (Signed)
The pt is waiting for mri  he was sent here from [place] a and o x4 no distress

## 2022-04-06 NOTE — ED Provider Notes (Signed)
Pt transferred here from [place] for MRI.  Pt feels better after valium and antivert.  MRI reviewed.  I agree with the radiologist.    MRI: Normal brain MRI. No acute intracranial abnormality identified.   Pt is stable for d/c.  Return if worse.  F/u with pcp.   Isla Pence, MD 04/06/22 [phone]

## 2022-04-06 NOTE — ED Provider Notes (Signed)
[facility] Provider Note   CSN: [id number] Arrival date & time: 04/06/22  1149     History  Chief Complaint  Patient presents with   Dizziness    Joshua Randolph is a 52 y.o. male with history of vertigo and Meniere's disease presents with multiple episodes of dizziness, along with numbness and tinging in bilateral lower extremities and bilateral shoulders and neck x 5 days. He endorses associated headache, nasal congestion and some blurriness in his peripheral vision. He states the first episode was on Monday and lasted about an hour and has had multiple episodes since lasting anywhere between 15 min and 2 hours. He denies these episodes feeling like previous cases of vertigo or Meniere's. He is nauseous but denies any vomiting, abdominal pain, or changes in urination or defecation. No recent illnesses or vaccinations. He took Sudafed this morning but otherwise has not tried any medications. He reports his mother has a history of benign brain tumor and  had similar symptoms to what he is experiencing now so he is worried about the possibility of having one himself.   Dizziness      Home Medications Prior to Admission medications   Medication Sig Start Date End Date Taking? Authorizing Provider  amoxicillin-clavulanate (AUGMENTIN) 875-125 MG tablet Take 1 tablet by mouth 2 (two) times daily. 11/27/21   Brunetta Jeans, PA-C  fluticasone (FLONASE) 50 MCG/ACT nasal spray Place 2 sprays into both nostrils daily. 11/27/21   Brunetta Jeans, PA-C  meclizine (ANTIVERT) 25 MG tablet Take 1 tablet (25 mg total) by mouth 3 (three) times daily as needed for dizziness. 01/29/22   Apolonio Schneiders, FNP      Allergies    Patient has no known allergies.    Review of Systems   Review of Systems  Neurological:  Positive for dizziness.    Physical Exam Updated Vital Signs BP 111/81   Pulse (!) 56   Temp (!) 97.1 F (36.2 C) (Temporal)   Resp 12    SpO2 95%  Physical Exam  ED Results / Procedures / Treatments   Labs (all labs ordered are listed, but only abnormal results are displayed) Labs Reviewed  URINALYSIS, ROUTINE W REFLEX MICROSCOPIC - Abnormal; Notable for the following components:      Result Value   Hgb urine dipstick SMALL (*)    All other components within normal limits  CBG MONITORING, ED - Abnormal; Notable for the following components:   Glucose-Capillary 116 (*)    All other components within normal limits  CBC  BASIC METABOLIC PANEL    EKG EKG Interpretation  Date/Time:  Friday April 06 2022 12:02:22 EST Ventricular Rate:  70 PR Interval:  140 QRS Duration: 86 QT Interval:  412 QTC Calculation: 444 R Axis:   75 Text Interpretation: Normal sinus rhythm Normal ECG When compared with ECG of 12-May-2003 08:29, QT has lengthened Confirmed by Octaviano Glow [phone]) on 04/06/2022 12:39:19 PM  Radiology No results found.  Procedures Procedures    Medications Ordered in ED Medications - No data to display  ED Course/ Medical Decision Making/ A&P Clinical Course as of 04/06/22 1606  Fri Apr 06, 2022  1501 I reevaluated the patient and at bedside. He states that his nausea is improved. I stood him to try and ambulate however he was too ataxic and I had him sit back down immediately. [AH]  1525 Case discussed with Dr. Cheral Marker who states that he is unsure of exactly  what to do at this time with this patient.  He asks that I touch base with ENT to discuss the symptoms and if this is not consistent with Meniere's he agrees with MRI. [AH]    Clinical Course User Index [AH] Margarita Mail, PA-C                             Medical Decision Making 52 year old male here with atypical ataxia and nausea.  Better with rest, worse with any movement.  He still very ataxic.  I discussed the case with Dr. Cheral Marker who asked me to reach out as per ED course to ENT. I discussed the case with Dr. Marcelline Deist on-call  for ENT who states that he has never heard of any Mnire's disease presentation consistent with patient's current symptoms.  Case discussed with Dr. Marye Round who is excepted the patient in transfer to [facility].  He will go by POV. Patient is still having ataxia.  He will need MRI of the brain with and without contrast.  If negative and ataxia is improved suspect he can be treated for peripheral vertigo and be discharged.  Amount and/or Complexity of Data Reviewed Labs: ordered. Radiology: ordered.  Risk Prescription drug management.           Final Clinical Impression(s) / ED Diagnoses Final diagnoses:  None    Rx / DC Orders ED Discharge Orders     None         Margarita Mail, PA-C 04/06/22 1836    Blanchie Dessert, MD 04/07/22 [phone]

## 2022-04-06 NOTE — ED Notes (Signed)
Spoke to Doctor, general practice at [facility] report given on this Pt.

## 2022-04-06 NOTE — ED Notes (Signed)
Pt states that touch is slightly less on left shoulder

## 2022-04-06 NOTE — ED Notes (Signed)
Patient transported to CT 

## 2022-04-09 ENCOUNTER — Ambulatory Visit: Payer: Commercial Managed Care - PPO | Admitting: Family Medicine

## 2022-04-14 ENCOUNTER — Telehealth: Payer: Commercial Managed Care - PPO | Admitting: Nurse Practitioner

## 2022-04-14 DIAGNOSIS — L03113 Cellulitis of right upper limb: Secondary | ICD-10-CM

## 2022-04-14 MED ORDER — IBUPROFEN 600 MG PO TABS: 600.0000 mg | ORAL_TABLET | Freq: Three times a day (TID) | ORAL | 0 refills | Status: DC | PRN

## 2022-04-14 MED ORDER — SULFAMETHOXAZOLE-TRIMETHOPRIM 800-160 MG PO TABS: 1.0000 | ORAL_TABLET | Freq: Two times a day (BID) | ORAL | 0 refills | Status: AC

## 2022-04-14 NOTE — Patient Instructions (Signed)
Joshua Randolph, thank you for joining Gildardo Pounds, NP for today's virtual visit.  While this provider is not your primary care provider (PCP), if your PCP is located in our provider database this encounter information will be shared with them immediately following your visit.   [facility] account gives you access to today's visit and all your visits, tests, and labs performed at [facility] " click here if you don't have a [facility] account or go to [email or web address]  Consent: (Patient) Joshua Randolph provided verbal consent for this virtual visit at the beginning of the encounter.  Current Medications:  Current Outpatient Medications:    ibuprofen (ADVIL) 600 MG tablet, Take 1 tablet (600 mg total) by mouth every 8 (eight) hours as needed., Disp: 30 tablet, Rfl: 0   sulfamethoxazole-trimethoprim (BACTRIM DS) 800-160 MG tablet, Take 1 tablet by mouth 2 (two) times daily for 5 days., Disp: 10 tablet, Rfl: 0   amoxicillin-clavulanate (AUGMENTIN) 875-125 MG tablet, Take 1 tablet by mouth 2 (two) times daily., Disp: 14 tablet, Rfl: 0   diazepam (VALIUM) 2 MG tablet, Take 1 tablet (2 mg total) by mouth every 6 (six) hours as needed for anxiety., Disp: 10 tablet, Rfl: 0   fluticasone (FLONASE) 50 MCG/ACT nasal spray, Place 2 sprays into both nostrils daily., Disp: 16 g, Rfl: 0   meclizine (ANTIVERT) 25 MG tablet, Take 1 tablet (25 mg total) by mouth 3 (three) times daily as needed for dizziness., Disp: 30 tablet, Rfl: 0   Medications ordered in this encounter:  Meds ordered this encounter  Medications   ibuprofen (ADVIL) 600 MG tablet    Sig: Take 1 tablet (600 mg total) by mouth every 8 (eight) hours as needed.    Dispense:  30 tablet    Refill:  0    Order Specific Question:   Supervising Provider    Answer:   [facility] Pick[facility] [[id number]]   sulfamethoxazole-trimethoprim (BACTRIM DS) 800-160 MG tablet    Sig: Take 1 tablet by mouth 2 (two) times  daily for 5 days.    Dispense:  10 tablet    Refill:  0    Order Specific Question:   Supervising Provider    Answer:   Ch[facility] Picket [id number]     *If you need refills on other medications prior to your next appointment, please contact your pharmacy*  Follow-Up: Call back or seek an in-person evaluation if the symptoms worsen or if the condition fails to improve as anticipated.  [facility] [phone]  Other Instructions Will treat empirically for soft tissue infection in addition to conservative measures- sulfamethoxazole-trimethoprim (BACTRIM DS) 800-160 MG tablet; Take 1 tablet by mouth 2 (two) times daily for 5 days.  Dispense: 10 tablet; Refill: 0 Apply warm compresses to affected area Arm sleeve for compression  Elevate arm as much as possible   If you have been instructed to have an in-person evaluation today at a local Urgent Care facility, please use the link below. It will take you to a list of all of our available [facility], including address, phone number and hours of operation. Please do not delay care.  [facility]  If you or a family member do not have a primary care provider, use the link below to schedule a visit and establish care. When you choose a  primary care physician or advanced practice provider, you gain a long-term partner in health. Find a  Primary Care Provider  Learn more about [facility] in-office and virtual care options: [facility]

## 2022-04-14 NOTE — Progress Notes (Signed)
Virtual Visit Consent   Joshua Randolph, you are scheduled for a virtual visit with a [facility] provider today. Just as with appointments in the office, your consent must be obtained to participate. Your consent will be active for this visit and any virtual visit you may have with one of our providers in the next 365 days. If you have a MyChart account, a copy of this consent can be sent to you electronically.  As this is a virtual visit, video technology does not allow for your provider to perform a traditional examination. This may limit your provider's ability to fully assess your condition. If your provider identifies any concerns that need to be evaluated in person or the need to arrange testing (such as labs, EKG, etc.), we will make arrangements to do so. Although advances in technology are sophisticated, we cannot ensure that it will always work on either your end or our end. If the connection with a video visit is poor, the visit may have to be switched to a telephone visit. With either a video or telephone visit, we are not always able to ensure that we have a secure connection.  By engaging in this virtual visit, you consent to the provision of healthcare and authorize for your insurance to be billed (if applicable) for the services provided during this visit. Depending on your insurance coverage, you may receive a charge related to this service.  I need to obtain your verbal consent now. Are you willing to proceed with your visit today? Joshua Randolph has provided verbal consent on 04/14/2022 for a virtual visit (video or telephone). Gildardo Pounds, NP  Date: 04/14/2022 3:12 PM  Virtual Visit via Video Note   I, Gildardo Pounds, connected with  Joshua Randolph  ([id number], [birth year]) on 04/14/22 at  3:00 PM EST by a video-enabled telemedicine application and verified that I am speaking with the correct person using two identifiers.  Location: Patient: Virtual Visit Location Patient:  Home Provider: Virtual Visit Location Provider: Home Office   I discussed the limitations of evaluation and management by telemedicine and the availability of in person appointments. The patient expressed understanding and agreed to proceed.    History of Present Illness: Joshua Randolph is a 52 y.o. who identifies as a male who was assigned male at birth, and is being seen today for skn infection.  Joshua Randolph had IV contrast through IV several days ago. He has recently been experiencing redness, warmth, swelling and a tightness sensation in the biceps area of the right arm. Denies fever or any purulent drainage.  DDX: phlebitis, cellulitis, dermatitis   Problems:  Patient Active Problem List   Diagnosis Date Noted   Triquetral chip fracture, left, closed, initial encounter 11/10/2018   HYPERLIPIDEMIA 11/02/2009   FEVER UNSPECIFIED 11/02/2009   HEADACHE 11/02/2009   UNSPECIFIED OTITIS MEDIA 08/04/2009   ACUTE SINUSITIS, UNSPECIFIED 08/04/2009    Allergies: No Known Allergies Medications:  Current Outpatient Medications:    ibuprofen (ADVIL) 600 MG tablet, Take 1 tablet (600 mg total) by mouth every 8 (eight) hours as needed., Disp: 30 tablet, Rfl: 0   sulfamethoxazole-trimethoprim (BACTRIM DS) 800-160 MG tablet, Take 1 tablet by mouth 2 (two) times daily for 5 days., Disp: 10 tablet, Rfl: 0   amoxicillin-clavulanate (AUGMENTIN) 875-125 MG tablet, Take 1 tablet by mouth 2 (two) times daily., Disp: 14 tablet, Rfl: 0   diazepam (VALIUM) 2 MG tablet, Take 1 tablet (2 mg total) by mouth every 6 (six) hours  as needed for anxiety., Disp: 10 tablet, Rfl: 0   fluticasone (FLONASE) 50 MCG/ACT nasal spray, Place 2 sprays into both nostrils daily., Disp: 16 g, Rfl: 0   meclizine (ANTIVERT) 25 MG tablet, Take 1 tablet (25 mg total) by mouth 3 (three) times daily as needed for dizziness., Disp: 30 tablet, Rfl: 0  Observations/Objective: Patient is well-developed, well-nourished in no acute distress.   Resting comfortably  at home.  Head is normocephalic, atraumatic.  No labored breathing.  Speech is clear and coherent with logical content.  Patient is alert and oriented at baseline.  Right bicep area of arm is swollen, erythematous   Assessment and Plan: 1. Cellulitis of right upper extremity - ibuprofen (ADVIL) 600 MG tablet; Take 1 tablet (600 mg total) by mouth every 8 (eight) hours as needed.  Dispense: 30 tablet; Refill: 0 Will treat empirically for soft tissue infection in addition to conservative measures- sulfamethoxazole-trimethoprim (BACTRIM DS) 800-160 MG tablet; Take 1 tablet by mouth 2 (two) times daily for 5 days.  Dispense: 10 tablet; Refill: 0 Apply warm compresses to affected area Arm sleeve for compression  Elevate arm as much as possible  Follow Up Instructions: I discussed the assessment and treatment plan with the patient. The patient was provided an opportunity to ask questions and all were answered. The patient agreed with the plan and demonstrated an understanding of the instructions.  A copy of instructions were sent to the patient via MyChart unless otherwise noted below.    The patient was advised to call back or seek an in-person evaluation if the symptoms worsen or if the condition fails to improve as anticipated.  Time:  I spent 11 minutes with the patient via telehealth technology discussing the above problems/concerns.    Gildardo Pounds, NP

## 2022-04-18 ENCOUNTER — Other Ambulatory Visit (HOSPITAL_BASED_OUTPATIENT_CLINIC_OR_DEPARTMENT_OTHER): Payer: Self-pay

## 2022-04-18 ENCOUNTER — Ambulatory Visit: Payer: Commercial Managed Care - PPO | Admitting: Family Medicine

## 2022-04-18 ENCOUNTER — Encounter: Payer: Self-pay | Admitting: Family Medicine

## 2022-04-18 VITALS — BP 120/64 | Ht 74.0 in | Wt 206.0 lb

## 2022-04-18 DIAGNOSIS — G43809 Other migraine, not intractable, without status migrainosus: Secondary | ICD-10-CM

## 2022-04-18 MED ORDER — SUMATRIPTAN SUCCINATE 50 MG PO TABS
50.0000 mg | ORAL_TABLET | ORAL | 0 refills | Status: DC | PRN
  Filled 2022-04-18: qty 18, 30d supply, fill #0

## 2022-04-18 NOTE — Assessment & Plan Note (Signed)
Acutely occurring with at least a handful of flares that are not responding to his medication for Mnire's disease.  He was diagnosed with Mnire's roughly 10 years ago.  Imaging of his head and neck have been normal.  May be under more stress than normal.  No acute illnesses.  Symptoms seem more consistent with a vestibular migraine as to the neck pain and changes in sensation in his legs. -Counseled on home exercise therapy and supportive care. -Sumatriptan. -Referral to neurology.

## 2022-04-18 NOTE — Progress Notes (Signed)
  Joshua Randolph - 52 y.o. male MRN [id number]  Date of birth: 1970/07/07  SUBJECTIVE:  Including CC & ROS.  No chief complaint on file.   Joshua Randolph is a 52 y.o. male that is presenting with neck pain, leg weakness, vertigo and headache.  Symptoms of acutely gotten worse over the past week.  He is having symptoms that are lasting several minutes in duration.  He has a history of Mnire's disease but his current medications and not updating his acute flares.  No recent illness and no head injury.  No recent change of medication.  Review of the audiology note from 12/11 shows he has moderate hearing loss in the right ear and suggested to try hearing aids and refer to ENT. Review of the emergency department note from 1/26 shows he was counseled on supportive care. Review of the CT angio head and neck from 1/26 shows no acute changes. Review of the MRI brain with and without contrast from 1/26 shows a normal brain MRI.  Review of Systems See HPI   HISTORY: Past Medical, Surgical, Social, and Family History Reviewed & Updated per EMR.   Pertinent Historical Findings include:  Past Medical History:  Diagnosis Date   Hyperlipemia     Past Surgical History:  Procedure Laterality Date   ANKLE SURGERY     LAMINECTOMY       PHYSICAL EXAM:  VS: BP 120/64 (BP Location: Left Arm, Patient Position: Sitting)   Ht 6\' 2"  (1.88 m)   Wt 206 lb (93.4 kg)   BMI 26.45 kg/m  Physical Exam Gen: NAD, alert, cooperative with exam, well-appearing MSK:  Neurovascularly intact       ASSESSMENT & PLAN:   Vestibular migraine Acutely occurring with at least a handful of flares that are not responding to his medication for Mnire's disease.  He was diagnosed with Mnire's roughly 10 years ago.  Imaging of his head and neck have been normal.  May be under more stress than normal.  No acute illnesses.  Symptoms seem more consistent with a vestibular migraine as to the neck pain and changes in  sensation in his legs. -Counseled on home exercise therapy and supportive care. -Sumatriptan. -Referral to neurology.

## 2022-04-18 NOTE — Patient Instructions (Signed)
Good to see you You can try the sumatriptan as an abortive We have made a referral to neurology   Please send me a message in MyChart with any questions or updates.  Please see me back as needed.   --Dr. Raeford Razor

## 2022-04-20 ENCOUNTER — Encounter: Payer: Self-pay | Admitting: Family Medicine

## 2022-04-23 ENCOUNTER — Ambulatory Visit (HOSPITAL_BASED_OUTPATIENT_CLINIC_OR_DEPARTMENT_OTHER)
Admission: RE | Admit: 2022-04-23 | Discharge: 2022-04-23 | Disposition: A | Discharge status: Home / Self Care | Payer: Commercial Managed Care - PPO | Source: Ambulatory Visit | Attending: Family Medicine | Admitting: Family Medicine

## 2022-04-23 ENCOUNTER — Other Ambulatory Visit (HOSPITAL_BASED_OUTPATIENT_CLINIC_OR_DEPARTMENT_OTHER): Payer: Self-pay

## 2022-04-23 ENCOUNTER — Encounter: Payer: Self-pay | Admitting: Family Medicine

## 2022-04-23 ENCOUNTER — Ambulatory Visit: Payer: Self-pay

## 2022-04-23 ENCOUNTER — Ambulatory Visit: Payer: Commercial Managed Care - PPO | Admitting: Family Medicine

## 2022-04-23 VITALS — BP 120/74 | Ht 74.0 in | Wt 207.0 lb

## 2022-04-23 DIAGNOSIS — I82619 Acute embolism and thrombosis of superficial veins of unspecified upper extremity: Secondary | ICD-10-CM | POA: Diagnosis not present

## 2022-04-23 DIAGNOSIS — I82611 Acute embolism and thrombosis of superficial veins of right upper extremity: Secondary | ICD-10-CM | POA: Diagnosis not present

## 2022-04-23 DIAGNOSIS — I82621 Acute embolism and thrombosis of deep veins of right upper extremity: Secondary | ICD-10-CM

## 2022-04-23 DIAGNOSIS — M79601 Pain in right arm: Secondary | ICD-10-CM

## 2022-04-23 MED ORDER — RIVAROXABAN (XARELTO) VTE STARTER PACK (15 & 20 MG)
ORAL_TABLET | ORAL | 0 refills | Status: DC
  Filled 2022-04-23: qty 51, 30d supply, fill #0

## 2022-04-23 NOTE — Addendum Note (Signed)
Addended by: Clearance Coots E on: 04/23/2022 12:12 PM   Modules accepted: Level of Service

## 2022-04-23 NOTE — Patient Instructions (Signed)
Good to see you I'll send in a blood thinner if needed once I get the ultrasound results back   Please send me a message in MyChart with any questions or updates.  Please see me back as needed.   --Dr. Raeford Razor

## 2022-04-23 NOTE — Addendum Note (Signed)
Addended by: Rosemarie Ax on: 04/23/2022 12:07 PM   Modules accepted: Orders

## 2022-04-23 NOTE — Assessment & Plan Note (Addendum)
Acutely occurring after recent IV access for imaging.  Appears more superficial but will check to make sure it is not deep DVT. -Counseled on home exercise therapy and supportive care. -Stat US Duplex of right UE  Addendum: Duplex was revealing and confirming the superficial clot in the basilic vein.  This may be traumatic with no other clot history. -Discussed with Dr. Marin Olp and will start Xarelto anticoagulation. -Could consider referral to hematology.

## 2022-04-23 NOTE — Progress Notes (Addendum)
  Joshua Randolph - 52 y.o. male MRN [id number]  Date of birth: [birth year]  SUBJECTIVE:  Including CC & ROS.  No chief complaint on file.   Joshua Randolph is a 52 y.o. male that is presenting with right arm pain.  The pain is occurring in the antecubital fossa.  He received IV contrast when he had a CT scan.  He has been treated with antibiotics and the hardness and pain still continue..   Review of Systems See HPI   HISTORY: Past Medical, Surgical, Social, and Family History Reviewed & Updated per EMR.   Pertinent Historical Findings include:  Past Medical History:  Diagnosis Date   Hyperlipemia     Past Surgical History:  Procedure Laterality Date   ANKLE SURGERY     LAMINECTOMY       PHYSICAL EXAM:  VS: BP 120/74   Ht 6\' 2"  (1.88 m)   Wt 207 lb (93.9 kg)   BMI 26.58 kg/m  Physical Exam Gen: NAD, alert, cooperative with exam, well-appearing MSK:  Neurovascularly intact    Limited ultrasound: Right arm pain:  A clot is appreciated with the mid humerus that extends distally into the mid volar forearm.  This appears in the basilic vein that Joshua Randolph labeled with the brachial vein.  Summary: Findings consistent with an upper extremity blood clot.  Ultrasound and interpretation by Clearance Coots, MD    ASSESSMENT & PLAN:   Basilic vein thrombosis Acutely occurring after recent IV access for imaging.  Appears more superficial but will check to make sure it is not deep DVT. -Counseled on home exercise therapy and supportive care. -Stat Joshua Randolph Duplex of right UE  Addendum: Duplex was revealing and confirming the superficial clot in the basilic vein.  This may be traumatic with no other clot history. -Discussed with Dr. Marin Olp and will start Xarelto anticoagulation. -Could consider referral to hematology.

## 2022-04-26 ENCOUNTER — Telehealth: Payer: Self-pay | Admitting: *Deleted

## 2022-04-26 ENCOUNTER — Encounter: Payer: Self-pay | Admitting: Family Medicine

## 2022-04-26 ENCOUNTER — Telehealth: Payer: Commercial Managed Care - PPO | Admitting: Physician Assistant

## 2022-04-26 ENCOUNTER — Telehealth: Payer: Self-pay | Admitting: Family Medicine

## 2022-04-26 DIAGNOSIS — R310 Gross hematuria: Secondary | ICD-10-CM | POA: Diagnosis not present

## 2022-04-26 NOTE — Telephone Encounter (Signed)
I spoke to pt- he had a virtual visit today and was advised to continue Xarelto and monitor.  Patient is establishing care with Dr. Nani Ravens on 04/27/22.

## 2022-04-26 NOTE — Telephone Encounter (Signed)
Patient called this morning c/o hematuria after taking 4 doses of Xarelto. He states he took Xarelto 15 mg bid on 2/13 and 2/14.   He states the blood was noticeable, but minimal. I forwarded patient's myChart message to our sister clinic for review since Dr. Raeford Razor is out of the office the rest of this week.

## 2022-04-26 NOTE — Telephone Encounter (Signed)
Pt called to see about establishing care for himself and his 50yo son with Dr. Nani Ravens. They both just got on with [facility] ins and wanted to establish with a [facility] provider close to them.

## 2022-04-26 NOTE — Progress Notes (Signed)
Virtual Visit Consent   Joshua Randolph, you are scheduled for a virtual visit with a [facility] provider today. Just as with appointments in the office, your consent must be obtained to participate. Your consent will be active for this visit and any virtual visit you may have with one of our providers in the next 365 days. If you have a MyChart account, a copy of this consent can be sent to you electronically.  As this is a virtual visit, video technology does not allow for your provider to perform a traditional examination. This may limit your provider's ability to fully assess your condition. If your provider identifies any concerns that need to be evaluated in person or the need to arrange testing (such as labs, EKG, etc.), we will make arrangements to do so. Although advances in technology are sophisticated, we cannot ensure that it will always work on either your end or our end. If the connection with a video visit is poor, the visit may have to be switched to a telephone visit. With either a video or telephone visit, we are not always able to ensure that we have a secure connection.  By engaging in this virtual visit, you consent to the provision of healthcare and authorize for your insurance to be billed (if applicable) for the services provided during this visit. Depending on your insurance coverage, you may receive a charge related to this service.  I need to obtain your verbal consent now. Are you willing to proceed with your visit today? Joshua Randolph has provided verbal consent on 04/26/2022 for a virtual visit (video or telephone). Leeanne Rio, [place]  Date: 04/26/2022 1:54 PM  Virtual Visit via Video Note   I, Leeanne Rio, connected with  Joshua Randolph  ([id number], 1971-02-01) on 04/26/22 at  1:30 PM EST by a video-enabled telemedicine application and verified that I am speaking with the correct person using two identifiers.  Location: Patient: Virtual Visit Location  Patient: Home Provider: Virtual Visit Location Provider: Home Office   I discussed the limitations of evaluation and management by telemedicine and the availability of in person appointments. The patient expressed understanding and agreed to proceed.    History of Present Illness: Joshua Randolph is a 52 y.o. who identifies as a male who was assigned male at birth, and is being seen today for noted blood in the urine described as pink-tinged urine noted this morning. Is currently on day 3 of taking Xarelto 15 mg BID for a basilic DVT of R arm diagnosed via [place] by his Sports Medicine Provider, Dr. Raeford Razor. Notes he contacted their office first but he is out of the office until the middle of next week. Has not had a PCP but is establishing with [facility] [place]. The provider he is to establish with is out of office today. He denies any easy bruising, gingival bleeding with brushing of the teeth. Denies dysuria, urinary urgency, frequency. No know history of BPH or other urinary issue. No history of smoking. Has not taken any of his Xarelto today since seeing that this morning. Is unsure of what to do.   HPI: HPI  Problems:  Patient Active Problem List   Diagnosis Date Noted   Basilic vein thrombosis [id number]   Vestibular migraine 04/18/2022   Triquetral chip fracture, left, closed, initial encounter 11/10/2018   HYPERLIPIDEMIA 11/02/2009   FEVER UNSPECIFIED 11/02/2009   HEADACHE 11/02/2009   UNSPECIFIED OTITIS MEDIA 08/04/2009   ACUTE SINUSITIS, UNSPECIFIED 08/04/2009  Allergies: No Known Allergies Medications:  Current Outpatient Medications:    diazepam (VALIUM) 2 MG tablet, Take 1 tablet (2 mg total) by mouth every 6 (six) hours as needed for anxiety., Disp: 10 tablet, Rfl: 0   RIVAROXABAN (XARELTO) VTE STARTER PACK (15 & 20 MG), Follow package directions: Take one 61m tablet by mouth twice a day. On day 22, switch to one 264mtablet once a day. Take with food., Disp: 51 each,  Rfl: 0   SUMAtriptan (IMITREX) 50 MG tablet, Take 1 tablet (50 mg total) by mouth every 2 (two) hours as needed for migraine. May repeat in 2 hours if headache persists or recurs., Disp: 30 tablet, Rfl: 0  Observations/Objective: Patient is well-developed, well-nourished in no acute distress.  Resting comfortably at home.  Head is normocephalic, atraumatic.  No labored breathing. Speech is clear and coherent with logical content.  Patient is alert and oriented at baseline.   Assessment and Plan: 1. Hematuria, gross  Only slight tinge of blood in urine x 1. Giving risk associated with not treating his current DVT, and absence of other signs of easy bruising or bleeding, would have him continue his Xarelto doses as directed for now, but want to get him in for in-person evaluation to have exam and urinalysis/microscopy to further assess and make sure no other potential cause of symptoms.   Able to get him an appointment with his new PCP for tomorrow at 11. Patient is aware PCP office will be reaching out to him to set this up.   Follow Up Instructions: I discussed the assessment and treatment plan with the patient. The patient was provided an opportunity to ask questions and all were answered. The patient agreed with the plan and demonstrated an understanding of the instructions.  A copy of instructions were sent to the patient via MyChart unless otherwise noted below.   The patient was advised to call back or seek an in-person evaluation if the symptoms worsen or if the condition fails to improve as anticipated.  Time:  I spent 10 minutes with the patient via telehealth technology discussing the above problems/concerns.    WiLeeanne RioPA-C

## 2022-04-26 NOTE — Patient Instructions (Signed)
  Mamie Laurel, thank you for joining Leeanne Rio, PA-C for today's virtual visit.  While this provider is not your primary care provider (PCP), if your PCP is located in our provider database this encounter information will be shared with them immediately following your visit.   [facility] account gives you access to today's visit and all your visits, tests, and labs performed at [facility] " click here if you don't have a [facility] account or go to [email or web address]  Consent: (Patient) Joshua Randolph provided verbal consent for this virtual visit at the beginning of the encounter.  Current Medications:  Current Outpatient Medications:    diazepam (VALIUM) 2 MG tablet, Take 1 tablet (2 mg total) by mouth every 6 (six) hours as needed for anxiety., Disp: 10 tablet, Rfl: 0   RIVAROXABAN (XARELTO) VTE STARTER PACK (15 & 20 MG), Follow package directions: Take one 15mg  tablet by mouth twice a day. On day 22, switch to one 20mg  tablet once a day. Take with food., Disp: 51 each, Rfl: 0   SUMAtriptan (IMITREX) 50 MG tablet, Take 1 tablet (50 mg total) by mouth every 2 (two) hours as needed for migraine. May repeat in 2 hours if headache persists or recurs., Disp: 30 tablet, Rfl: 0   Medications ordered in this encounter:  No orders of the defined types were placed in this encounter.    *If you need refills on other medications prior to your next appointment, please contact your pharmacy*  Follow-Up: Call back or seek an in-person evaluation if the symptoms worsen or if the condition fails to improve as anticipated.  [facility] [phone]  Other Instructions Continue the Xarelto for now.  Keep hydrated. Monitor for any recurrence of symptoms.  Follow-up tomorrow at Dr. Irene Limbo office as scheduled.    If you have been instructed to have an in-person evaluation today at a local Urgent Care facility, please use the link  below. It will take you to a list of all of our available [facility], including address, phone number and hours of operation. Please do not delay care.  [facility]  If you or a family member do not have a primary care provider, use the link below to schedule a visit and establish care. When you choose a [facility] primary care physician or advanced practice provider, you gain a long-term partner in health. Find a Primary Care Provider  Learn more about [facility]'s in-office and virtual care options: [facility]

## 2022-04-27 ENCOUNTER — Ambulatory Visit: Payer: Commercial Managed Care - PPO | Admitting: Family Medicine

## 2022-04-27 ENCOUNTER — Encounter: Payer: Self-pay | Admitting: Family Medicine

## 2022-04-27 ENCOUNTER — Other Ambulatory Visit: Payer: Commercial Managed Care - PPO

## 2022-04-27 ENCOUNTER — Other Ambulatory Visit: Payer: Self-pay | Admitting: Family Medicine

## 2022-04-27 VITALS — BP 120/68 | HR 75 | Temp 97.5°F | Ht 74.0 in | Wt 215.1 lb

## 2022-04-27 DIAGNOSIS — I82619 Acute embolism and thrombosis of superficial veins of unspecified upper extremity: Secondary | ICD-10-CM

## 2022-04-27 DIAGNOSIS — R310 Gross hematuria: Secondary | ICD-10-CM | POA: Diagnosis not present

## 2022-04-27 LAB — URINALYSIS, MICROSCOPIC ONLY

## 2022-04-27 NOTE — Patient Instructions (Addendum)
If you do not hear anything about your referral in the next 1-2 weeks, call our office and ask for an update.  We will be in touch regarding your urine results.   Send me a message if you need more Xarelto prior to seeing the specialist.   Let us know if you need anything.

## 2022-04-27 NOTE — Progress Notes (Signed)
Chief Complaint  Patient presents with   New Patient (Initial Visit)    Vertigo and Migraines Would like to go off [place] Patient Visit SUBJECTIVE: HPI: Joshua Randolph is an 52 y.o.male who is being seen for establishing care.  The patient was previously seen at [facility].  On 04/06/2022, the patient went to the emergency department due to vertigo, which has recurred over the past 10 years, and he was told he might be having a stroke.  CTA and MRI was unremarkable.  He did require contrast and ended up having pain and swelling at the right inner elbow area several days later.  He saw his sports physician who performed an ultrasound and thought he might have seen a clot and sent him for a formal Doppler.  It did show a superficial basilar vein thrombosis.  He has no history of clotting disorders.  No family history of clotting disorders.  He stays active.  He does not have any swelling, shortness of breath, chest pain, coughing.  He is currently taking Xarelto 20 mg daily.  He reports compliance with this.  He has not seen a hematologist.  He did notice some pinkish urine several days ago.  He has increased his fluid intake and has not noticed any blood since then.  No pain or trauma.  Past Medical History:  Diagnosis Date   Hyperlipemia    Meniere disease, right 2014   Past Surgical History:  Procedure Laterality Date   ANKLE SURGERY     LAMINECTOMY     History reviewed. No pertinent family history. No Known Allergies  Current Outpatient Medications:    RIVAROXABAN (XARELTO) VTE STARTER PACK (15 & 20 MG), Follow package directions: Take one 93m tablet by mouth twice a day. On day 22, switch to one 29mtablet once a day. Take with food., Disp: 51 each, Rfl: 0   SUMAtriptan (IMITREX) 50 MG tablet, Take 1 tablet (50 mg total) by mouth every 2 (two) hours as needed for migraine. May repeat in 2 hours if headache persists or recurs., Disp: 30 tablet, Rfl: 0  OBJECTIVE: BP 120/68 (BP  Location: Left Arm, Patient Position: Sitting, Cuff Size: Normal)   Pulse 75   Temp (!) 97.5 F (36.4 C) (Oral)   Ht 6' 2"$  (1.88 m)   Wt 215 lb 2 oz (97.6 kg)   SpO2 97%   BMI 27.62 kg/m  General:  well developed, well nourished, in no apparent distress Throat/Pharynx:  lips and gingiva without lesion; tongue and uvula midline; non-inflamed pharynx; no exudates or postnasal drainage Lungs:  clear to auscultation, breath sounds equal bilaterally, no respiratory distress Cardio:  regular rate and rhythm, no LE edema or bruits Musculoskeletal: Ropey vein noted over the right basilar vein with mild TTP; no edema, fluctuance, excessive warmth, drainage Neuro:  gait normal Psych: well oriented with normal range of affect and appropriate judgment/insight  ASSESSMENT/PLAN: Hematuria, gross - Plan: Urine Microscopic Only  Basilic vein thrombosis - Plan: Ambulatory referral to Hematology / Oncology  Check urine today.  If no RBCs, will check again in 2 weeks.  If there is blood, will refer to urology. Given location, I told him I am not comfortable telling him he can stop taking Xarelto.  Continue Xarelto 20 mg daily for now, will refer to hematology for their opinion. Patient should return in 6 months for physical. The patient voiced understanding and agreement to the plan.   Joshua OystereChristiana  DO 04/27/22  12:00 PM

## 2022-04-28 LAB — URINE CULTURE
MICRO NUMBER:: 14576212
Result:: NO GROWTH
SPECIMEN QUALITY:: ADEQUATE

## 2022-04-30 ENCOUNTER — Ambulatory Visit: Payer: Commercial Managed Care - PPO | Admitting: Neurology

## 2022-05-03 ENCOUNTER — Inpatient Hospital Stay: Payer: Commercial Managed Care - PPO | Attending: Hematology & Oncology

## 2022-05-03 ENCOUNTER — Ambulatory Visit: Payer: Commercial Managed Care - PPO | Admitting: Neurology

## 2022-05-03 ENCOUNTER — Encounter: Payer: Self-pay | Admitting: Neurology

## 2022-05-03 ENCOUNTER — Inpatient Hospital Stay (HOSPITAL_BASED_OUTPATIENT_CLINIC_OR_DEPARTMENT_OTHER): Payer: Commercial Managed Care - PPO | Admitting: Medical Oncology

## 2022-05-03 ENCOUNTER — Encounter: Payer: Self-pay | Admitting: Medical Oncology

## 2022-05-03 ENCOUNTER — Other Ambulatory Visit (HOSPITAL_BASED_OUTPATIENT_CLINIC_OR_DEPARTMENT_OTHER): Payer: Self-pay

## 2022-05-03 VITALS — BP 149/73 | HR 64 | Temp 98.2°F | Resp 17 | Wt 215.0 lb

## 2022-05-03 VITALS — BP 118/65 | HR 63 | Ht 74.0 in | Wt 214.0 lb

## 2022-05-03 DIAGNOSIS — G43809 Other migraine, not intractable, without status migrainosus: Secondary | ICD-10-CM

## 2022-05-03 DIAGNOSIS — R42 Dizziness and giddiness: Secondary | ICD-10-CM | POA: Diagnosis not present

## 2022-05-03 DIAGNOSIS — H8101 Meniere's disease, right ear: Secondary | ICD-10-CM | POA: Diagnosis not present

## 2022-05-03 DIAGNOSIS — Z7901 Long term (current) use of anticoagulants: Secondary | ICD-10-CM | POA: Diagnosis not present

## 2022-05-03 DIAGNOSIS — H8109 Meniere's disease, unspecified ear: Secondary | ICD-10-CM

## 2022-05-03 DIAGNOSIS — I82619 Acute embolism and thrombosis of superficial veins of unspecified upper extremity: Secondary | ICD-10-CM

## 2022-05-03 DIAGNOSIS — E785 Hyperlipidemia, unspecified: Secondary | ICD-10-CM

## 2022-05-03 DIAGNOSIS — H9319 Tinnitus, unspecified ear: Secondary | ICD-10-CM

## 2022-05-03 DIAGNOSIS — H9190 Unspecified hearing loss, unspecified ear: Secondary | ICD-10-CM

## 2022-05-03 DIAGNOSIS — I8290 Acute embolism and thrombosis of unspecified vein: Secondary | ICD-10-CM | POA: Insufficient documentation

## 2022-05-03 LAB — LACTATE DEHYDROGENASE: LDH: 145 U/L (ref 98–192)

## 2022-05-03 LAB — CBC WITH DIFFERENTIAL (CANCER CENTER ONLY)
Abs Immature Granulocytes: 0 10*3/uL (ref 0.00–0.07)
Basophils Absolute: 0.1 10*3/uL (ref 0.0–0.1)
Basophils Relative: 1 %
Eosinophils Absolute: 0.1 10*3/uL (ref 0.0–0.5)
Eosinophils Relative: 2 %
HCT: 45.6 % (ref 39.0–52.0)
Hemoglobin: 15.7 g/dL (ref 13.0–17.0)
Immature Granulocytes: 0 %
Lymphocytes Relative: 31 %
Lymphs Abs: 1.9 10*3/uL (ref 0.7–4.0)
MCH: 31.7 pg (ref 26.0–34.0)
MCHC: 34.4 g/dL (ref 30.0–36.0)
MCV: 91.9 fL (ref 80.0–100.0)
Monocytes Absolute: 0.5 10*3/uL (ref 0.1–1.0)
Monocytes Relative: 8 %
Neutro Abs: 3.6 10*3/uL (ref 1.7–7.7)
Neutrophils Relative %: 58 %
Platelet Count: 232 10*3/uL (ref 150–400)
RBC: 4.96 MIL/uL (ref 4.22–5.81)
RDW: 12.2 % (ref 11.5–15.5)
WBC Count: 6.2 10*3/uL (ref 4.0–10.5)
nRBC: 0 % (ref 0.0–0.2)

## 2022-05-03 LAB — CMP (CANCER CENTER ONLY)
ALT: 43 U/L (ref 0–44)
AST: 24 U/L (ref 15–41)
Albumin: 4.9 g/dL (ref 3.5–5.0)
Alkaline Phosphatase: 65 U/L (ref 38–126)
Anion gap: 10 (ref 5–15)
BUN: 15 mg/dL (ref 6–20)
CO2: 26 mmol/L (ref 22–32)
Calcium: 9.7 mg/dL (ref 8.9–10.3)
Chloride: 102 mmol/L (ref 98–111)
Creatinine: 1.19 mg/dL (ref 0.61–1.24)
GFR, Estimated: >60 mL/min (ref >60)
Glucose, Bld: 159 mg/dL — ABNORMAL HIGH (ref 70–99)
Potassium: 4.2 mmol/L (ref 3.5–5.1)
Sodium: 138 mmol/L (ref 135–145)
Total Bilirubin: 0.6 mg/dL (ref 0.3–1.2)
Total Protein: 7.2 g/dL (ref 6.5–8.1)

## 2022-05-03 MED ORDER — ZEMBRACE SYMTOUCH 3 MG/0.5ML ~~LOC~~ SOAJ: 3.0000 mg | Freq: Once | SUBCUTANEOUS | 0 refills | Status: DC | PRN

## 2022-05-03 MED ORDER — RIVAROXABAN 20 MG PO TABS
20.0000 mg | ORAL_TABLET | Freq: Every day | ORAL | 2 refills | Status: DC
  Filled 2022-05-03 – 2022-05-15 (×2): qty 30, 30d supply, fill #0

## 2022-05-03 MED ORDER — TOSYMRA 10 MG/ACT NA SOLN: 1.0000 | NASAL | 11 refills | Status: DC | PRN

## 2022-05-03 NOTE — Patient Instructions (Addendum)
[facility]ra: [facility] up ot 3 sprays a day A sample of Zembrace injection. If it doesn;t work you can take another form of sumatriptan in 2 hours. If you like it we can prescribe ENT referral : diagnosed with meneiere's in the past. He has vertigo, hearing loss, ear fullness, tinnitus.   Orders Placed This Encounter  Procedures   Ambulatory referral to ENT   Meds ordered this encounter  Medications   SUMAtriptan (TOSYMRA) 10 MG/ACT SOLN    Sig: Place 1 spray into the nose every hour as needed. Max 3 doses a day.    Dispense:  6 each    Refill:  11   SUMAtriptan Succinate (ZEMBRACE SYMTOUCH) 3 MG/0.5ML SOAJ    Sig: Inject 3 mg into the skin once as needed for up to 1 dose. May repeat in 15 minutes. If symptoms persist, repeat in 2 hours. Max 4 injections daily.    Dispense:  1 mL    Refill:  0    [id number] 05/2023     Sumatriptan Nasal Spray What is this medication? SUMATRIPTAN (soo ma TRIP tan) treats migraines. It works by blocking pain signals and narrowing blood vessels in the brain. It belongs to a group of medications called triptans. It is not used to prevent migraines. This medicine may be used for other purposes; ask your health care provider or pharmacist if you have questions. COMMON BRAND NAME(S): Imitrex, Tosymra What should I tell my care team before I take this medication? They need to know if you have any of these conditions: Cigarette smoker Circulation problems in fingers and toes Heart disease High blood pressure High blood sugar (diabetes) High cholesterol History of irregular heartbeat History of stroke Kidney disease Liver disease Stomach or intestine problems An unusual or allergic reaction to sumatriptan, other medications, foods, dyes, or preservatives Pregnant or trying to get pregnant Breast-feeding How should I use this medication? This medication is for use in the nose. Follow the directions on your product or prescription label. Do not use  more often than directed. Make sure that you are using your nasal spray correctly. Ask your care team if you have any questions. Talk to your care team regarding the use of this medication in children. Special care may be needed. Overdosage: If you think you have taken too much of this medicine contact a poison control center or emergency room at once. NOTE: This medicine is only for you. Do not share this medicine with others. What if I miss a dose? This does not apply. This medication is not for regular use. What may interact with this medication? Do not take this medication with any of the following: Certain medications for migraine headache like almotriptan, eletriptan, frovatriptan, naratriptan, rizatriptan, sumatriptan, zolmitriptan Ergot alkaloids like dihydroergotamine, ergonovine, ergotamine, methylergonovine MAOIs like Carbex, Eldepryl, Marplan, Nardil, and Parnate This medication may also interact with the following: Certain medications for depression, anxiety, or psychotic disorders This list may not describe all possible interactions. Give your health care provider a list of all the medicines, herbs, non-prescription drugs, or dietary supplements you use. Also tell them if you smoke, drink alcohol, or use illegal drugs. Some items may interact with your medicine. What should I watch for while using this medication? Visit your care team for regular checks on your progress. Tell your care team if your symptoms do not start to get better or if they get worse. You may get drowsy or dizzy. Do not drive, use machinery, or do  anything that needs mental alertness until you know how this medication affects you. Do not stand up or sit up quickly, especially if you are an older patient. This reduces the risk of dizzy or fainting spells. Alcohol may interfere with the effect of this medication. Tell your care team right away if you have any change in your eyesight. If you take migraine medications  for 10 or more days a month, your migraines may get worse. Keep a diary of headache days and medication use. Contact your care team if your migraine attacks occur more frequently. What side effects may I notice from receiving this medication? Side effects that you should report to your care team as soon as possible: Allergic reactions--skin rash, itching, hives, swelling of the face, lips, tongue, or throat Burning, pain, tingling, or color changes in the legs or feet Heart attack--pain or tightness in the chest, shoulders, arms, or jaw, nausea, shortness of breath, cold or clammy skin, feeling faint or lightheaded Heart rhythm changes--fast or irregular heartbeat, dizziness, feeling faint or lightheaded, chest pain, trouble breathing Increase in blood pressure Raynaud's--cool, numb, or painful fingers or toes that may change color from pale, to blue, to red Seizures Serotonin syndrome--irritability, confusion, fast or irregular heartbeat, muscle stiffness, twitching muscles, sweating, high fever, seizure, chills, vomiting, diarrhea Stroke--sudden numbness or weakness of the face, arm, or leg, trouble speaking, confusion, trouble walking, loss of balance or coordination, dizziness, severe headache, change in vision Sudden or severe stomach pain, nausea, vomiting, fever, or bloody diarrhea Vision loss Side effects that usually do not require medical attention (report to your care team if they continue or are bothersome): Change in taste Dizziness General discomfort or fatigue Irritation inside the nose or throat This list may not describe all possible side effects. Call your doctor for medical advice about side effects. You may report side effects to FDA at 1-800-FDA-1088. Where should I keep my medication? Keep out of the reach of children and pets. Store at room temperature between 2 and 30 degrees C (36 and 86 degrees F). Throw away any unused medication after the expiration date. NOTE: This  sheet is a summary. It may not cover all possible information. If you have questions about this medicine, talk to your doctor, pharmacist, or health care provider.  2023 Elsevier/Gold Standard (2020-02-29 00:00:00) Sumatriptan Injection What is this medication? SUMATRIPTAN (soo ma TRIP tan) treats migraines and cluster headaches. It works by blocking pain signals and narrowing blood vessels in the brain. It belongs to a group of medications called triptans. It is not used to prevent headaches or migraines. This medicine may be used for other purposes; ask your health care provider or pharmacist if you have questions. COMMON BRAND NAME(S): [place]sum[place], Imitrex, Imitrex STAT dose, Sumavel DosePro System, ZEMBRACE What should I tell my care team before I take this medication? They need to know if you have any of these conditions: Cigarette smoker Circulation problems in fingers and toes Diabetes Heart disease High blood pressure High cholesterol History of irregular heartbeat History of stroke Kidney disease Liver disease Stomach or intestine problems An unusual or allergic reaction to sumatriptan, latex, other medications, foods, dyes, or preservatives Pregnant or trying to get pregnant Breast-feeding How should I use this medication? This medication is for injection under the skin. You will be taught how to prepare and give this medication. Use exactly as directed. Do not take your medication more often than directed. Talk to your care team regarding the use of this  medication in children. Special care may be needed. Overdosage: If you think you have taken too much of this medicine contact a poison control center or emergency room at once. NOTE: This medicine is only for you. Do not share this medicine with others. What if I miss a dose? This does not apply. This medication is not for regular use. What may interact with this medication? Do not take this medication with any of the  following: Certain medications for migraine headache like almotriptan, eletriptan, frovatriptan, naratriptan, rizatriptan, sumatriptan, zolmitriptan Ergot alkaloids like dihydroergotamine, ergonovine, ergotamine, methylergonovine MAOIs like Carbex, Eldepryl, Marplan, Nardil, and Parnate This medication may also interact with the following: Certain medications for depression, anxiety, or psychotic disorders This list may not describe all possible interactions. Give your health care provider a list of all the medicines, herbs, non-prescription drugs, or dietary supplements you use. Also tell them if you smoke, drink alcohol, or use illegal drugs. Some items may interact with your medicine. What should I watch for while using this medication? Visit your care team for regular checks on your progress. Tell your care team if your symptoms do not start to get better or if they get worse. You may get drowsy or dizzy. Do not drive, use machinery, or do anything that needs mental alertness until you know how this medication affects you. Do not stand up or sit up quickly, especially if you are an older patient. This reduces the risk of dizzy or fainting spells. Alcohol may interfere with the effect of this medication. Tell your care team right away if you have any change in your eyesight. If you take migraine medications for 10 or more days a month, your migraines may get worse. Keep a diary of headache days and medication use. Contact your care team if your migraine attacks occur more frequently. What side effects may I notice from receiving this medication? Side effects that you should report to your care team as soon as possible: Allergic reactions--skin rash, itching, hives, swelling of the face, lips, tongue, or throat Burning, pain, tingling, or color changes in the legs or feet Heart attack--pain or tightness in the chest, shoulders, arms, or jaw, nausea, shortness of breath, cold or clammy skin, feeling  faint or lightheaded Heart rhythm changes--fast or irregular heartbeat, dizziness, feeling faint or lightheaded, chest pain, trouble breathing Increase in blood pressure Raynaud's--cool, numb, or painful fingers or toes that may change color from pale, to blue, to red Seizures Serotonin syndrome--irritability, confusion, fast or irregular heartbeat, muscle stiffness, twitching muscles, sweating, high fever, seizure, chills, vomiting, diarrhea Stroke--sudden numbness or weakness of the face, arm, or leg, trouble speaking, confusion, trouble walking, loss of balance or coordination, dizziness, severe headache, change in vision Sudden or severe stomach pain, nausea, vomiting, fever, or bloody diarrhea Vision loss Side effects that usually do not require medical attention (report to your care team if they continue or are bothersome): Dizziness Facial flushing, redness General discomfort or fatigue This list may not describe all possible side effects. Call your doctor for medical advice about side effects. You may report side effects to FDA at 1-800-FDA-1088. Where should I keep my medication? Keep out of the reach of children and pets. You will be instructed on how to store this medication. Throw away any unused medication after the expiration date on the label. NOTE: This sheet is a summary. It may not cover all possible information. If you have questions about this medicine, talk to your doctor, pharmacist, or health  care provider.  2023 Elsevier/Gold Standard (2020-02-29 00:00:00)

## 2022-05-03 NOTE — Addendum Note (Signed)
Addended by: Nelwyn Salisbury on: 05/03/2022 02:33 PM   Modules accepted: Orders

## 2022-05-03 NOTE — Progress Notes (Signed)
[place] [phone]   Fax:([phone] [facility] NOTE  Patient Care Team: Shelda Pal, DO as PCP - General (Family Medicine)  Hematological/Oncological History # None  CHIEF COMPLAINTS/PURPOSE OF CONSULTATION:  "Blood clot "  HISTORY OF PRESENTING ILLNESS:  Joshua Randolph 52 y.o. 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  Patient states that he suspects that the cause of this thrombus was due to complications from an IV that was placed on 04/06/2022.  He was seen in the emergency department for ataxia and dizziness.  He had 2 different imaging studies completed with contrast that day.  Likely he was cleared of a stroke and was suspected to have had a vestibular migraine.  Patient reports that a few days later he developed hardening and pain around the site of the IV of his right arm.  Subsequently found to have the basophilic vein thrombosis.  He was started on Xarelto which she has tolerated fairly well.  He has had mild hematuria in the mornings however this has improved over the past few days.  He estimates that he has been on Xarelto for about 10 days total.  He denies any epistaxis, hemoptysis, bleeding of the gums, bleeding or bruising other than the hematuria.  He also denies any chest pain, shortness of breath or calf pain.  He has no history of prior blood clots.  No family history of blood clots.  No family history of clotting diseases.  No recent surgeries or long travel.  Patient is not morbidly obese.    MEDICAL HISTORY:  Past Medical History:  Diagnosis Date   Hyperlipemia    Meniere disease, right 2014    SURGICAL HISTORY: Past Surgical History:  Procedure Laterality Date   ANKLE SURGERY     LAMINECTOMY      SOCIAL HISTORY: Social History   Socioeconomic History    Marital status: Married    Spouse name: Not on file   Number of children: Not on file   Years of education: Not on file   Highest education level: Not on file  Occupational History   Not on file  Tobacco Use   Smoking status: Never   Smokeless tobacco: Never  Vaping Use   Vaping Use: Never used  Substance and Sexual Activity   Alcohol use: Yes    Comment: rarely   Drug use: No   Sexual activity: Not on file  Other Topics Concern   Not on file  Social History Narrative   Not on file   Social Determinants of Health   Financial Resource Strain: Unknown (05/03/2022)   Overall Financial Resource Strain (CARDIA)    Difficulty of Paying Living Expenses: Patient refused  Food Insecurity: Unknown (05/03/2022)   Hunger Vital Sign    Worried About Running Out of Food in the Last Year: Patient refused    [place] in the Last Year: Patient refused  Transportation Needs: Unknown (05/03/2022)   PRAPARE - Transportation    Lack of Transportation (Medical): Patient refused    Lack of Transportation (Non-Medical): Patient refused  Physical Activity: Unknown (05/03/2022)   Exercise Vital Sign    Days of Exercise per Week: Patient refused    Minutes of Exercise per Session: Patient refused  Stress: Not on file  Social Connections: Unknown (05/03/2022)   Social Connection and Isolation Panel [NHANES]  Frequency of Communication with Friends and Family: Patient refused    Frequency of Social Gatherings with Friends and Family: Patient refused    Attends Religious Services: Patient refused    Active Member of Clubs or Organizations: Patient refused    Attends Archivist Meetings: Patient refused    Marital Status: Patient refused  Intimate Partner Violence: Unknown (05/03/2022)   Humiliation, Afraid, Rape, and Kick questionnaire    Fear of Current or Ex-Partner: Patient refused    Emotionally Abused: Patient refused    Physically Abused: Patient refused    Sexually Abused:  Patient refused    FAMILY HISTORY: History reviewed. No pertinent family history.  ALLERGIES:  has No Known Allergies.  MEDICATIONS:  Current Outpatient Medications  Medication Sig Dispense Refill   RIVAROXABAN (XARELTO) VTE STARTER PACK (15 & 20 MG) Follow package directions: Take one 47m tablet by mouth twice a day. On day 22, switch to one 234mtablet once a day. Take with food. 51 each 0   SUMAtriptan (IMITREX) 50 MG tablet Take 1 tablet (50 mg total) by mouth every 2 (two) hours as needed for migraine. May repeat in 2 hours if headache persists or recurs. 30 tablet 0   No current facility-administered medications for this visit.    REVIEW OF SYSTEMS:   Constitutional: ( - ) fevers, ( - )  chills , ( - ) night sweats Eyes: ( - ) blurriness of vision, ( - ) double vision, ( - ) watery eyes Ears, nose, mouth, throat, and face: ( - ) mucositis, ( - ) sore throat Respiratory: ( - ) cough, ( - ) dyspnea, ( - ) wheezes Cardiovascular: ( - ) palpitation, ( - ) chest discomfort, ( - ) lower extremity swelling Gastrointestinal:  ( - ) nausea, ( - ) heartburn, ( - ) change in bowel habits Skin: ( - ) abnormal skin rashes Lymphatics: ( - ) new lymphadenopathy, ( - ) easy bruising Neurological: ( - ) numbness, ( - ) tingling, ( - ) new weaknesses Behavioral/Psych: ( - ) mood change, ( - ) new changes  All other systems were reviewed with the patient and are negative.  PHYSICAL EXAMINATION: ECOG PERFORMANCE STATUS: 0 - Asymptomatic  Vitals:   05/03/22 1318  BP: (!) 149/73  Pulse: 64  Resp: 17  Temp: 98.2 F (36.8 C)  SpO2: 98%   Filed Weights   05/03/22 1318  Weight: 215 lb (97.5 kg)    GENERAL: well appearing male in NAD  SKIN: skin color, texture, turgor are normal, no rashes or significant lesions EYES: conjunctiva are pink and non-injected, sclera clear OROPHARYNX: no exudate, no erythema; lips, buccal mucosa, and tongue normal  NECK: supple, non-tender LYMPH:  no  palpable lymphadenopathy in the cervical, axillary or supraclavicular lymph nodes.  LUNGS: clear to auscultation and percussion with normal breathing effort HEART: regular rate & rhythm and no murmurs and no lower extremity edema ABDOMEN: soft, non-tender, non-distended, normal bowel sounds Musculoskeletal: no cyanosis of digits and no clubbing  PSYCH: alert & oriented x 3, fluent speech NEURO: no focal motor/sensory deficits  LABORATORY DATA:  I have reviewed the data as listed    Latest Ref Rng & Units 05/03/2022    1:02 PM 04/06/2022   12:43 PM 09/07/2015    1:01 AM  CBC  WBC 4.0 - 10.5 K/uL 6.2  7.3  11.0   Hemoglobin 13.0 - 17.0 g/dL 15.7  16.3  15.1   Hematocrit 39.0 -  52.0 % 45.6  46.6  43.5   Platelets 150 - 400 K/uL 232  231  217        Latest Ref Rng & Units 05/03/2022    1:02 PM 04/06/2022   12:43 PM 09/07/2015    1:01 AM  CMP  Glucose 70 - 99 mg/dL 159  113  115   BUN 6 - 20 mg/dL 15  19  24   $ Creatinine 0.61 - 1.24 mg/dL 1.19  1.10  1.22   Sodium 135 - 145 mmol/L 138  137  137   Potassium 3.5 - 5.1 mmol/L 4.2  3.9  3.6   Chloride 98 - 111 mmol/L 102  102  102   CO2 22 - 32 mmol/L 26  26  27   $ Calcium 8.9 - 10.3 mg/dL 9.7  9.4  9.2   Total Protein 6.5 - 8.1 g/dL 7.2     Total Bilirubin 0.3 - 1.2 mg/dL 0.6     Alkaline Phos 38 - 126 U/L 65     AST 15 - 41 U/L 24     ALT 0 - 44 U/L 43      ASSESSMENT & PLAN Encounter Diagnosis  Name Primary?   Basilic vein thrombosis Yes   Basilic vein thrombosis: New to me.  Appears provoked secondary to IV and contrast.  He has no significant family or personal history of blood clots.  For completeness, and due to him being young, we discussed completing lab work to further assess for any clotting diseases to which he is agreeable.  He is being seen in the future by urology secondary to his hematuria on the Xarelto which I think is a good idea.  As this is improving I do not want to remove him from the Xarelto at this time.  Today  we discussed typical length of Xarelto therapy should this be a provoked DVT.  We also discussed repeat imaging with ultrasound however it appears that his symptoms have completely resolved and there is no sign of any abnormality on exam.   A provoked venous thromboembolism (VTE) is one that has a clear inciting factor or event. Provoking factors include prolonged travel/immobility, surgery (particular abdominal or orthropedic), trauma,  and pregnancy/ estrogen containing birth control. This patient was reported to have recent IV with contrast x 2 in the same area as the blood clot, which would qualify as a transient provoking factor. As such we would recommend 3-6 months of anticoagulation therapy with consideration of additional therapy if symptoms persist. The anticoagulation therapy of choice in this situation is xeralto. The patient currently has a supply of this medication and is able to afford it without difficult. We will plan to see the patient back in 1 months time to reassess and assure they are doing well on treatment.  #Provoked DVT/Pulmonary Embolism --findings at this time are consistent with a provoked VTE --will order baseline CMP and CBC to assure labs are adequate for DOAC therapy --recommend the patient continue xarelto 17m BID x 21 days followed by 2107mPO daily --patient denies any bleeding, bruising, or dark stools on this medication. It is well tolerated. No difficulties accessing/affording the medication --RTC in 1 months' time with strict return precautions for overt signs of bleeding.     Orders Placed This Encounter  Procedures   CBC with Differential ([facility])    Standing Status:   Future    Number of Occurrences:   1    Standing  Expiration Date:   05/04/2023   CMP ([facility] only)    Standing Status:   Future    Number of Occurrences:   1    Standing Expiration Date:   05/04/2023   Lactate dehydrogenase (LDH)    Standing Status:   Future    Number  of Occurrences:   1    Standing Expiration Date:   05/03/2023    All questions were answered. The patient knows to call the clinic with any problems, questions or concerns.  I have spent a total of 30 minutes minutes of face-to-face and non-face-to-face time, preparing to see the patient, obtaining and/or reviewing separately obtained history, performing a medically appropriate examination, counseling and educating the patient, ordering medications/tests/procedures, referring and communicating with other health care professionals, documenting clinical information in the electronic health record, independently interpreting results and communicating results to the patient, and care coordination.    Nelwyn Salisbury PA-C Department of Hematology/Oncology [facility]

## 2022-05-03 NOTE — Progress Notes (Signed)
[facility]    Provider:  Dr Jaynee Eagles Requesting Provider: Clearance Coots Primary Care Provider:  Shelda Pal, DO  CC:  vestibular migraines  HPI:  Joshua Randolph is a 52 y.o. male here as requested by Jefm Petty, MD for migraines.  Past medical history of vestibular migraines, basilic superficial vein thrombosis, HLD, meniere diseaseacute sinusitis, otitis media, hyperlipidemia, headache.  MRI of the brain a few weeks ago was normal.  Patient was referred by Dr. Raeford Razor for migraine, he presented with neck pain, leg weakness, vertigo and headache symptoms acutely getting worse over the past week I reviewed his notes, having symptoms that are lasting several minutes in duration, he has a history of Mnire's disease but his current medications are not updating his acute flares, no recent illness and no head injury, no recent change in medication, review of audiology note from December 11 shows he has moderate hearing loss in the right ear and they suggested trying hearing aids and a referral to ENT, he was also in the emergency room January 26 and had a CT angio head and neck which showed no acute changes and MRI of the brain was normal as well.  He was diagnosed with Mnire's disease roughly 10 years ago, may be more under stress recently, counseled on home exercise therapy and supportive care, given sumatriptan hand and referred to neurology.  I reviewed Dr. Irene Limbo notes, he is being referred to hematology oncology for his vein thrombosis especially for their opinion on his Xarelto.  He was in the ED for vertigo on January 26, [facility], MRI CTA head and neck unremarkable, and he felt better after Valium and Antivert.  The last ENT note that I see is from Dr. Lucia Gaskins in 2020, he was told he had Mnire's disease 5 years prior 20 2:15 episodes of vertigo and he has not been able to hear out of his right ear as well since that time.  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 Hasn't seen ENT in 10 years. Meniere's presented in 2014 with tinnitus and vertigo. Had so significant episode of vertigo and tinnitus and it went away for a while. This [place] at the beach he had dizziness and ended up being an ear infection. 3 weeks ago his neck started getting tight, he went to the ED MRI/CTA H&N were normal, he was dizzy and nauseated. Valium and mecline helped. Another episode a few days later but didn;t help. Dr. Raeford Razor. He denies any headache more tightness in the neck. He had 8 episodes one day. Hasn't had any in 10 days. A week ago he was sweating, nausea, dry heaving. Meniere's flare up? He did the epley maneuver. But when he had those serious spisodes 10 years ago it was similar to the episode. The episodes can last 3 hours without treatment. He went to hematology and doing tests for a superficial blood clot. Never had a migraineous episode. He has a sister who claims to have migraines but also has addiction.  Reviewed notes, labs and imaging from outside physicians, which showed:  From a thorough review of records, medications tried that can be used in headache/migraine management include: Gabapentin, Imitrex, ibuprofen, ketorolac injections, meclizine, Zofran, oxycodone.     Latest Ref Rng & Units 05/03/2022    1:02 PM 04/06/2022   12:43 PM 09/07/2015    1:01 AM  CBC  WBC 4.0 - 10.5 K/uL 6.2  7.3  11.0   Hemoglobin 13.0 - 17.0 g/dL 15.7  16.3  15.1   Hematocrit 39.0 - 52.0 % 45.6  46.6  43.5   Platelets 150 - 400 K/uL 232  231  217       Latest Ref Rng & Units 05/03/2022    1:02 PM 04/06/2022   12:43 PM 09/07/2015    1:01 AM  CMP  Glucose 70 - 99 mg/dL 159  113  115   BUN 6 - 20 mg/dL '15  19  24   '$ Creatinine 0.61 - 1.24 mg/dL 1.19  1.10  1.22   Sodium 135 - 145 mmol/L 138  137  137   Potassium 3.5 - 5.1 mmol/L 4.2  3.9  3.6   Chloride 98 - 111 mmol/L 102  102  102   CO2 22 - 32 mmol/L '26  26  27   '$ Calcium 8.9 - 10.3 mg/dL 9.7  9.4  9.2   Total Protein  6.5 - 8.1 g/dL 7.2     Total Bilirubin 0.3 - 1.2 mg/dL 0.6     Alkaline Phos 38 - 126 U/L 65     AST 15 - 41 U/L 24     ALT 0 - 44 U/L 43      TSH 10/2021 normal  04/06/2022: MRI brain FINDINGS: Brain: Cerebral volume within normal limits for age. No focal parenchymal signal abnormality.   No abnormal foci of restricted diffusion to suggest acute or subacute ischemia. Gray-white matter differentiation well maintained. No encephalomalacia to suggest chronic cortical infarction or other insult. No foci of susceptibility artifact indicative of acute or chronic intracranial blood products.   No mass lesion, midline shift or mass effect. Ventricles normal in size and morphology without hydrocephalus. No extra-axial fluid collection.   Pituitary gland and suprasellar region within normal limits.   No abnormal enhancement.   Vascular: Major intracranial vascular flow voids are well maintained.   Skull and upper cervical spine: Craniocervical junction within normal limits. Visualized upper cervical spine demonstrates no significant finding. Bone marrow signal intensity within normal limits. No scalp soft tissue abnormality.   Sinuses/Orbits: Globes and orbital soft tissues are within normal limits.   Paranasal sinuses are largely clear. No significant mastoid effusion.   Other: None.   IMPRESSION: Normal brain MRI. No acute intracranial abnormality identified.  CTA H&N: TECHNIQUE: Multidetector CT imaging of the head and neck was performed using the standard protocol during bolus administration of intravenous contrast. Multiplanar CT image reconstructions and MIPs were obtained to evaluate the vascular anatomy. Carotid stenosis measurements (when applicable) are obtained utilizing NASCET criteria, using the distal internal carotid diameter as the denominator.   RADIATION DOSE REDUCTION: This exam was performed according to the departmental dose-optimization program which  includes automated exposure control, adjustment of the mA and/or kV according to patient size and/or use of iterative reconstruction technique.   CONTRAST:  42m OMNIPAQUE IOHEXOL 350 MG/ML SOLN   COMPARISON:  None Available.   FINDINGS: CT HEAD FINDINGS   Brain: No evidence of acute infarct, hemorrhage, mass, mass effect, or midline shift. No hydrocephalus or extra-axial fluid collection.   Vascular: No hyperdense vessel.   Skull: Normal. Negative for fracture or focal lesion.   Sinuses/Orbits: No acute finding.   Other: The mastoid air cells are well aerated.   CTA NECK FINDINGS   Aortic arch: Standard branching. Imaged portion shows no evidence of aneurysm or dissection. No significant stenosis of the major arch vessel origins.   Right carotid system: No evidence of stenosis, dissection, or occlusion.  Left carotid system: No evidence of stenosis, dissection, or occlusion.   Vertebral arteries: No evidence of stenosis, dissection, or occlusion.   Skeleton: No acute osseous abnormality.   Other neck: No acute finding.   Upper chest: No focal pulmonary opacity or pleural effusion., hypoplastic on the right   Review of the MIP images confirms the above findings   CTA HEAD FINDINGS   Anterior circulation: Both internal carotid arteries are patent to the termini, without significant stenosis.   A1 segments patent. Normal anterior communicating artery. Anterior cerebral arteries are patent to their distal aspects.   No M1 stenosis or occlusion. MCA branches perfused and symmetric.   Posterior circulation: Vertebral arteries patent to the vertebrobasilar junction without stenosis. Posterior inferior cerebellar artery patent on the right. The left PICA is not visualized.   Basilar patent to its distal aspect. Superior cerebellar arteries patent proximally.   Patent P1 segments, hypoplastic on the right. Near fetal origin of the right PCA from the right  posterior communicating artery. The left posterior communicating artery is also patent. PCAs perfused to their distal aspects without stenosis.   Venous sinuses: As permitted by contrast timing, patent.   Anatomic variants: None significant.   Review of the MIP images confirms the above findings   IMPRESSION: 1. No acute intracranial process. 2. No intracranial large vessel occlusion or significant stenosis. 3. No hemodynamically significant stenosis in the neck.    Review of Systems: Patient complains of symptoms per HPI as well as the following symptoms vertigo. Pertinent negatives and positives per HPI. All others negative.   Social History   Socioeconomic History   Marital status: Married    Spouse name: Not on file   Number of children: Not on file   Years of education: Not on file   Highest education level: Not on file  Occupational History   Not on file  Tobacco Use   Smoking status: Never   Smokeless tobacco: Never  Vaping Use   Vaping Use: Never used  Substance and Sexual Activity   Alcohol use: Yes    Comment: rarely   Drug use: No   Sexual activity: Not on file  Other Topics Concern   Not on file  Social History Narrative   Right handed   Caffeine: 1 cup/day   Lives at home with wife and 2 kids   Social Determinants of Health   Financial Resource Strain: Unknown (05/03/2022)   Overall Financial Resource Strain (CARDIA)    Difficulty of Paying Living Expenses: Patient refused  Food Insecurity: Unknown (05/03/2022)   Hunger Vital Sign    Worried About Running Out of Food in the Last Year: Patient refused    [place] in the Last Year: Patient refused  Transportation Needs: Unknown (05/03/2022)   PRAPARE - Transportation    Lack of Transportation (Medical): Patient refused    Lack of Transportation (Non-Medical): Patient refused  Physical Activity: Unknown (05/03/2022)   Exercise Vital Sign    Days of Exercise per Week: Patient refused     Minutes of Exercise per Session: Patient refused  Stress: Not on file  Social Connections: Unknown (05/03/2022)   Social Connection and Isolation Panel [NHANES]    Frequency of Communication with Friends and Family: Patient refused    Frequency of Social Gatherings with Friends and Family: Patient refused    Attends Religious Services: Patient refused    Active Member of Clubs or Organizations: Patient refused    Attends Club or  Organization Meetings: Patient refused    Marital Status: Patient refused  Intimate Partner Violence: Unknown (05/03/2022)   Humiliation, Afraid, Rape, and Kick questionnaire    Fear of Current or Ex-Partner: Patient refused    Emotionally Abused: Patient refused    Physically Abused: Patient refused    Sexually Abused: Patient refused    Family History  Problem Relation Age of Onset   Migraines Neg Hx     Past Medical History:  Diagnosis Date   Hyperlipemia    Meniere disease, right 2014    Patient Active Problem List   Diagnosis Date Noted   Basilic vein thrombosis [id number]   Vestibular migraine 04/18/2022   Triquetral chip fracture, left, closed, initial encounter 11/10/2018   HYPERLIPIDEMIA 11/02/2009   FEVER UNSPECIFIED 11/02/2009   HEADACHE 11/02/2009   UNSPECIFIED OTITIS MEDIA 08/04/2009   ACUTE SINUSITIS, UNSPECIFIED 08/04/2009    Past Surgical History:  Procedure Laterality Date   ANKLE SURGERY     LAMINECTOMY      Current Outpatient Medications  Medication Sig Dispense Refill   RIVAROXABAN (XARELTO) VTE STARTER PACK (15 & 20 MG) Follow package directions: Take one '15mg'$  tablet by mouth twice a day. On day 22, switch to one '20mg'$  tablet once a day. Take with food. 51 each 0   SUMAtriptan (IMITREX) 50 MG tablet Take 1 tablet (50 mg total) by mouth every 2 (two) hours as needed for migraine. May repeat in 2 hours if headache persists or recurs. 30 tablet 0   SUMAtriptan (TOSYMRA) 10 MG/ACT SOLN Place 1 spray into the nose every hour as  needed. Max 3 doses a day. 6 each 11   SUMAtriptan Succinate (ZEMBRACE SYMTOUCH) 3 MG/0.5ML SOAJ Inject 3 mg into the skin once as needed for up to 1 dose. May repeat in 15 minutes. If symptoms persist, repeat in 2 hours. Max 4 injections daily. 1 mL 0   rivaroxaban (XARELTO) 20 MG TABS tablet Take 1 tablet (20 mg total) by mouth daily with supper. To start after completing starter pack. (Patient not taking: Reported on 05/03/2022) 30 tablet 2   No current facility-administered medications for this visit.    Allergies as of 05/03/2022   (No Known Allergies)    Vitals: BP 118/65 (BP Location: Right Arm, Patient Position: Sitting)   Pulse 63   Ht '6\' 2"'$  (1.88 m)   Wt 214 lb (97.1 kg)   BMI 27.48 kg/m  Last Weight:  Wt Readings from Last 1 Encounters:  05/03/22 214 lb (97.1 kg)   Last Height:   Ht Readings from Last 1 Encounters:  05/03/22 '6\' 2"'$  (1.88 m)     Physical exam: Exam: Gen: NAD, conversant, well nourised, obese, well groomed                     CV: RRR, no MRG. No Carotid Bruits. No peripheral edema, warm, nontender Eyes: Conjunctivae clear without exudates or hemorrhage  Neuro: Detailed Neurologic Exam  Speech:    Speech is normal; fluent and spontaneous with normal comprehension.  Cognition:    The patient is oriented to person, place, and time;     recent and remote memory intact;     language fluent;     normal attention, concentration,     fund of knowledge Cranial Nerves:    The pupils are equal, round, and reactive to light. The fundi are normal and spontaneous venous pulsations are present. Visual fields are full to finger confrontation. Extraocular movements  are intact. Trigeminal sensation is intact and the muscles of mastication are normal. The face is symmetric. The palate elevates in the midline. Hearing intact. Voice is normal. Shoulder shrug is normal. The tongue has normal motion without fasciculations.   Coordination:    Normal finger to nose and  heel to shin. Normal rapid alternating movements.   Gait:    Heel-toe and tandem gait are normal.   Motor Observation:    No asymmetry, no atrophy, and no involuntary movements noted. Tone:    Normal muscle tone.    Posture:    Posture is normal. normal erect    Strength:    Strength is V/V in the upper and lower limbs.      Sensation: intact to LT     Reflex Exam:  DTR's:    Deep tendon reflexes in the upper and lower extremities are normal bilaterally.   Toes:    The toes are downgoing bilaterally.   Clonus:    Clonus is absent.    Assessment/Plan:   52 y.o. male here as requested by Jefm Petty, MD for migraines.  Past medical history of basilic superficial vein thrombosis, HLD, meniere disease, acute sinusitis, otitis media, hyperlipidemia, headache.  MRI of the brain a few weeks ago was normal as well as CTA H&N normal. The last ENT note that I see is from Dr. Lucia Gaskins in 2020, he was told he had Mnire's disease 5 years prior due to episodes of vertigo and he has not been able to hear out of his right ear as well since that time.  -he should have a follow-up with the ENT for his Mnire's disease due to vertigo, hearing loss, ear fullness. -At this time he has never been diagnosed with migraines, he has never been tried on a migraine preventative, if we were to try to treat him for migraines we would have to start with Topamax, nortriptyline and amitriptyline, possibly propranolol before we could give him any of the newer much better medication such as Riccardo Dubin, Kinder, Nurtec, [place]. - He was given sumatriptan, if his vertigo is migraine related taking that may help at onset which it has; will give him faster acting sumatriptan: [facility]ymra: [facility] ot 3 nasal sprays a day and  sample of Zembrace injection. If it doesn't work you can take another form of sumatriptan in 2 hours. If you like it we can prescribe - Menere's disease vs vestibular migraine vs  Cervicogenic dizziness  Orders Placed This Encounter  Procedures   Ambulatory referral to ENT   Meds ordered this encounter  Medications   SUMAtriptan (TOSYMRA) 10 MG/ACT SOLN    Sig: Place 1 spray into the nose every hour as needed. Max 3 doses a day.    Dispense:  6 each    Refill:  11   SUMAtriptan Succinate (ZEMBRACE SYMTOUCH) 3 MG/0.5ML SOAJ    Sig: Inject 3 mg into the skin once as needed for up to 1 dose. May repeat in 15 minutes. If symptoms persist, repeat in 2 hours. Max 4 injections daily.    Dispense:  1 mL    Refill:  0    [id number] 05/2023    Cc: Jefm Petty, MD,  Shelda Pal, DO  Sarina Ill, MD  [facility] [address] [place], [place] [address]  Phone [phone] Fax [phone]  I spent over 60 minutes of face-to-face and non-face-to-face time with patient on the  1. Vertigo  2. Meniere's disease, unspecified laterality   3. Vestibular migraine   4. Tinnitus, unspecified laterality   5. Hearing loss, unspecified hearing loss type, unspecified laterality    diagnosis.  This included previsit chart review, lab review, study review, order entry, electronic health record documentation, patient education on the different diagnostic and therapeutic options, counseling and coordination of care, risks and benefits of management, compliance, or risk factor reduction

## 2022-05-07 ENCOUNTER — Encounter: Payer: Self-pay | Admitting: Neurology

## 2022-05-07 ENCOUNTER — Telehealth: Payer: Self-pay | Admitting: Neurology

## 2022-05-07 ENCOUNTER — Ambulatory Visit: Payer: Commercial Managed Care - PPO | Admitting: Family Medicine

## 2022-05-07 NOTE — Telephone Encounter (Signed)
Referral for ENT fax to [facility]. Phone: [phone], Fax: [phone].

## 2022-05-08 ENCOUNTER — Encounter: Payer: Self-pay | Admitting: Family Medicine

## 2022-05-08 ENCOUNTER — Other Ambulatory Visit: Payer: Self-pay | Admitting: Family Medicine

## 2022-05-08 DIAGNOSIS — R310 Gross hematuria: Secondary | ICD-10-CM

## 2022-05-10 ENCOUNTER — Other Ambulatory Visit (HOSPITAL_BASED_OUTPATIENT_CLINIC_OR_DEPARTMENT_OTHER): Payer: Self-pay

## 2022-05-11 ENCOUNTER — Inpatient Hospital Stay: Payer: Commercial Managed Care - PPO | Attending: Hematology & Oncology

## 2022-05-11 DIAGNOSIS — Z7901 Long term (current) use of anticoagulants: Secondary | ICD-10-CM | POA: Insufficient documentation

## 2022-05-11 DIAGNOSIS — Z86718 Personal history of other venous thrombosis and embolism: Secondary | ICD-10-CM | POA: Insufficient documentation

## 2022-05-11 DIAGNOSIS — I82619 Acute embolism and thrombosis of superficial veins of unspecified upper extremity: Secondary | ICD-10-CM

## 2022-05-12 LAB — CARDIOLIPIN ANTIBODIES, IGG, IGM, IGA
Anticardiolipin IgA: <9 APL U/mL (ref 0–11)
Anticardiolipin IgG: <9 GPL U/mL (ref 0–14)
Anticardiolipin IgM: <9 MPL U/mL (ref 0–12)

## 2022-05-13 LAB — BETA-2-GLYCOPROTEIN I ABS, IGG/M/A
Beta-2 Glyco I IgG: <9 GPI IgG units (ref 0–20)
Beta-2-Glycoprotein I IgA: <9 GPI IgA units (ref 0–25)
Beta-2-Glycoprotein I IgM: <9 GPI IgM units (ref 0–32)

## 2022-05-15 ENCOUNTER — Other Ambulatory Visit (HOSPITAL_BASED_OUTPATIENT_CLINIC_OR_DEPARTMENT_OTHER): Payer: Self-pay

## 2022-05-16 ENCOUNTER — Encounter: Payer: Self-pay | Admitting: Urology

## 2022-05-16 ENCOUNTER — Ambulatory Visit: Payer: Commercial Managed Care - PPO | Admitting: Urology

## 2022-05-16 VITALS — BP 106/71 | HR 82 | Ht 74.0 in | Wt 207.0 lb

## 2022-05-16 DIAGNOSIS — R310 Gross hematuria: Secondary | ICD-10-CM | POA: Diagnosis not present

## 2022-05-16 LAB — URINALYSIS
Bilirubin, UA: NEGATIVE
Glucose, UA: NEGATIVE mg/dL
Ketones, POC UA: NEGATIVE mg/dL
Leukocytes, UA: NEGATIVE
Nitrite, UA: NEGATIVE
Protein Ur, POC: NEGATIVE mg/dL
Spec Grav, UA: 1.01 (ref 1.010–1.025)
Urobilinogen, UA: 0.2 E.U./dL
pH, UA: 6 (ref 5.0–8.0)

## 2022-05-16 NOTE — Progress Notes (Signed)
   Assessment: 1. Gross hematuria      Plan: Diagnosis and evaluation of gross hematuria was discussed in detail today with the patient.  I reviewed with him guidelines and recommendations for further evaluation including upper tract imaging and cystoscopy.  The rationale for these test as well as the nature of the procedures and potential adverse events and complications were reviewed.  He agrees to proceed. Schedule CT-hematuria protocol Follow-up for cystoscopy  Chief Complaint: blood in urine  History of Present Illness:  Joshua Randolph is a 52 y.o. male who is seen in consultation from Shelda Pal, DO for evaluation of hematuria.  The patient has a history of superficial basilic vein thrombosis and has been on Xarelto for a few weeks.  After starting the Xarelto he noted 1 episode of pink-tinged urine.  He has however had several urine with microscopic exams demonstrating significant microscopic hematuria on several occasions since January 2024 predating the xarelto.  No GU complaints.  Mild LUTS.  Never smoker.   Past Medical History:  Past Medical History:  Diagnosis Date   Hyperlipemia    Meniere disease, right 2014    Past Surgical History:  Past Surgical History:  Procedure Laterality Date   ANKLE SURGERY     LAMINECTOMY      Allergies:  No Known Allergies  Family History:  Family History  Problem Relation Age of Onset   Migraines Neg Hx     Social History:  Social History   Tobacco Use   Smoking status: Never   Smokeless tobacco: Never  Vaping Use   Vaping Use: Never used  Substance Use Topics   Alcohol use: Yes    Comment: rarely   Drug use: No    Review of symptoms:  Constitutional:  Negative for unexplained weight loss, night sweats, fever, chills ENT:  Negative for nose bleeds, sinus pain, painful swallowing CV:  Negative for chest pain, shortness of breath, exercise intolerance, palpitations, loss of consciousness Resp:  Negative  for cough, wheezing, shortness of breath GI:  Negative for nausea, vomiting, diarrhea, bloody stools GU:  Positives noted in HPI; otherwise negative for gross hematuria, dysuria, urinary incontinence Neuro:  Negative for seizures, poor balance, limb weakness, slurred speech Psych:  Negative for lack of energy, depression, anxiety Endocrine:  Negative for polydipsia, polyuria, symptoms of hypoglycemia (dizziness, hunger, sweating) Hematologic:  Negative for anemia, purpura, petechia, prolonged or excessive bleeding, use of anticoagulants  Allergic:  Negative for difficulty breathing or choking as a result of exposure to anything; no shellfish allergy; no allergic response (rash/itch) to materials, foods  Physical exam: BP 106/71   Pulse 82   Ht '6\' 2"'$  (1.88 m)   Wt 207 lb (93.9 kg)   BMI 26.58 kg/m  GENERAL APPEARANCE:  Well appearing, well developed, well nourished, NAD

## 2022-05-18 LAB — PROTHROMBIN GENE MUTATION

## 2022-05-20 ENCOUNTER — Ambulatory Visit (HOSPITAL_BASED_OUTPATIENT_CLINIC_OR_DEPARTMENT_OTHER): Payer: Commercial Managed Care - PPO

## 2022-05-21 ENCOUNTER — Other Ambulatory Visit (HOSPITAL_BASED_OUTPATIENT_CLINIC_OR_DEPARTMENT_OTHER): Payer: Self-pay

## 2022-05-22 ENCOUNTER — Ambulatory Visit (HOSPITAL_BASED_OUTPATIENT_CLINIC_OR_DEPARTMENT_OTHER)
Admission: RE | Admit: 2022-05-22 | Discharge: 2022-05-22 | Disposition: A | Discharge status: Home / Self Care | Payer: Commercial Managed Care - PPO | Source: Ambulatory Visit | Attending: Urology | Admitting: Urology

## 2022-05-22 DIAGNOSIS — K8020 Calculus of gallbladder without cholecystitis without obstruction: Secondary | ICD-10-CM | POA: Diagnosis not present

## 2022-05-22 DIAGNOSIS — K760 Fatty (change of) liver, not elsewhere classified: Secondary | ICD-10-CM | POA: Diagnosis not present

## 2022-05-22 DIAGNOSIS — R310 Gross hematuria: Secondary | ICD-10-CM | POA: Insufficient documentation

## 2022-05-22 LAB — FACTOR 5 LEIDEN

## 2022-05-22 MED ORDER — IOHEXOL 300 MG/ML  SOLN
125.0000 mL | Freq: Once | INTRAMUSCULAR | Status: AC | PRN
  Administered 2022-05-22: 125 mL via INTRAVENOUS

## 2022-05-29 ENCOUNTER — Ambulatory Visit: Payer: Commercial Managed Care - PPO | Admitting: Urology

## 2022-05-29 VITALS — BP 120/78 | HR 71

## 2022-05-29 DIAGNOSIS — R310 Gross hematuria: Secondary | ICD-10-CM

## 2022-05-29 DIAGNOSIS — R319 Hematuria, unspecified: Secondary | ICD-10-CM | POA: Diagnosis not present

## 2022-05-29 MED ORDER — CIPROFLOXACIN HCL 500 MG PO TABS
500.0000 mg | ORAL_TABLET | Freq: Once | ORAL | Status: AC
  Administered 2022-05-29: 500 mg via ORAL

## 2022-05-29 NOTE — Progress Notes (Signed)
   Assessment: 1. Gross hematuria    2.  Abnl prostate imaging on CT   Plan: Will send hematuria profile for further evaluation-  initial GU eval neg (ct and cysto) Will call with results and further recommendations  Concerning abnl prostate imaging- psa drawn today prior to cysto.  DRE normal. If psa no concern would not recommend further eval  Chief Complaint: Blood in urine  HPI: Joshua Randolph is a 52 y.o. male who presents for continued evaluation of hematuria. See my note 05/16/2022 at the time of initial visit for detailed history and exam. UA today shows >30 RBC/HPF  Patient has undergone CT hematuria evaluation-see full report below.  This showed normal-appearing upper urinary tracts and bladder.  There was note of the incidental findings of some nodularity involving the right prostate with intraprostatic calcifications.  Cysto 05/2022- normal DRE- normal   Portions of the above documentation were copied from a prior visit for review purposes only.  Allergies: No Known Allergies  PMH: Past Medical History:  Diagnosis Date   Hyperlipemia    Meniere disease, right 2014    PSH: Past Surgical History:  Procedure Laterality Date   ANKLE SURGERY     LAMINECTOMY      SH: Social History   Tobacco Use   Smoking status: Never   Smokeless tobacco: Never  Vaping Use   Vaping Use: Never used  Substance Use Topics   Alcohol use: Yes    Comment: rarely   Drug use: No    ROS: Constitutional:  Negative for fever, chills, weight loss CV: Negative for chest pain, previous MI, hypertension Respiratory:  Negative for shortness of breath, wheezing, sleep apnea, frequent cough GI:  Negative for nausea, vomiting, bloody stool, GERD  PE: There were no vitals taken for this visit. GENERAL APPEARANCE:  Well appearing, well developed, well nourished, NAD  GU:  nl ext genitaila DRE:  nst, prostate 30gm without n/i   Results: CT - heme eval 05/31/22 IMPRESSION: 1.  No hydronephrosis. No renal, ureteral or bladder calculi. 2. No suspicious renal mass. 3. Asymmetric nodular enhancement along the right side of the prostate gland, which is nonspecific but can be seen in the setting of prostate cancer. Consider correlation with PSA levels and further evaluation with MRI prostate. 4. Diffuse hepatic steatosis. 5. Cholelithiasis without findings of acute cholecystitis. 6. Sigmoid colonic diverticulosis without findings of acute diverticulitis. 7. Fluid density along the left iliopsoas bursa may reflect bursitis suggest correlation with left pelvic pain.   PROCEDURE: Procedure:  Flexible Cystourethroscopy  Pre-operative Diagnosis: Gross hematuria  Post-operative Diagnosis: Gross hematuria  Anesthesia:  local with lidocaine jelly  Surgical Narrative:  After appropriate informed consent was obtained, the patient was prepped and draped in the usual sterile fashion in the supine position.  The patient was correctly identified and the proper procedure delineated prior to proceeding.  Sterile lidocaine gel was instilled in the urethra. The flexible cystoscope was introduced without difficulty.  Findings:  Anterior urethra: Normal  Posterior urethra: Lateral lobe hypertrophy  Bladder: Normal  Ureteral orifices: normal  Additional findings:  Saline bladder wash for cytology was not performed.    The cystoscope was then removed.  The patient tolerated the procedure well.

## 2022-05-29 NOTE — Addendum Note (Signed)
Addended by: Dema Severin on: 05/29/2022 11:52 AM   Modules accepted: Orders

## 2022-05-30 LAB — PSA: Prostate Specific Ag, Serum: 0.7 ng/mL (ref 0.0–4.0)

## 2022-05-31 ENCOUNTER — Inpatient Hospital Stay: Payer: Commercial Managed Care - PPO | Admitting: Medical Oncology

## 2022-05-31 ENCOUNTER — Encounter: Payer: Self-pay | Admitting: Medical Oncology

## 2022-05-31 ENCOUNTER — Other Ambulatory Visit: Payer: Self-pay

## 2022-05-31 VITALS — BP 106/71 | HR 75 | Temp 98.2°F | Resp 18 | Ht 74.0 in | Wt 217.8 lb

## 2022-05-31 DIAGNOSIS — I82619 Acute embolism and thrombosis of superficial veins of unspecified upper extremity: Secondary | ICD-10-CM | POA: Diagnosis not present

## 2022-05-31 DIAGNOSIS — Z86718 Personal history of other venous thrombosis and embolism: Secondary | ICD-10-CM | POA: Diagnosis not present

## 2022-05-31 NOTE — Progress Notes (Signed)
Hematology and Oncology Follow Up Visit  Joshua Randolph [id number] Jun 07, 1970 52 y.o. 05/31/2022  Past Medical History:  Diagnosis Date   Hyperlipemia    Meniere disease, right 2014    Principle Diagnosis:  Basilic Vein Thrombosis - Provoked DVT  Current Therapy:   Xarelto  PriorTherapy:   None    Interim History:  Joshua Randolph is back for follow-up for his history of Basilic Vein thrombosis.   At our last visit he underwent testing to rule out predisposing causes of his blood clot other than from trauma/irritation of the IV. He had normal Beta-2-Glycoprotein, Cardiolipin antibodies were normal, Prothrombin gene mutation was not found, and Factor 5 Leiden testing was negative. 3-6 months of anticoagulation therapy recommended.   Today he reports that he is doing well but is tired of taking his Xeralto. He does not like taking this medication on a daily basis and requests to stop it as of today. He reports being frustrated as he has had hematuria on this medication off/on. He has had work up in ED and with Urology/Nephrology in regards to the hematuria. It is suspected that he has baseline benign microscopic hematuria but being on the xeralto has worsened this to where is is now gross hematuria. No other bleeding or bruising notes. No chest pain, SOB, calf pain. The site where the DVT occurred has fully resolved of the pain and edema as of week 1 of being on the xeralto. He has been on this medication for 7 weeks.      Wt Readings from Last 3 Encounters:  05/31/22 217 lb 12.8 oz (98.8 kg)  05/16/22 207 lb (93.9 kg)  05/03/22 214 lb (97.1 kg)     Medications:   Current Outpatient Medications:    rivaroxaban (XARELTO) 20 MG TABS tablet, Take 1 tablet (20 mg total) by mouth daily with supper. To start after completing starter pack., Disp: 30 tablet, Rfl: 2   SUMAtriptan (IMITREX) 50 MG tablet, Take 1 tablet (50 mg total) by mouth every 2 (two) hours as needed for migraine. May repeat in 2  hours if headache persists or recurs., Disp: 30 tablet, Rfl: 0   SUMAtriptan (TOSYMRA) 10 MG/ACT SOLN, Place 1 spray into the nose every hour as needed. Max 3 doses a day., Disp: 6 each, Rfl: 11   SUMAtriptan Succinate (ZEMBRACE SYMTOUCH) 3 MG/0.5ML SOAJ, Inject 3 mg into the skin once as needed for up to 1 dose. May repeat in 15 minutes. If symptoms persist, repeat in 2 hours. Max 4 injections daily., Disp: 1 mL, Rfl: 0  Allergies: No Known Allergies  Past Medical History, Surgical history, Social history, and Family History were reviewed and updated.  Review of Systems: Review of Systems  Constitutional:  Negative for unexpected weight change.  HENT:   Negative for nosebleeds.   Respiratory:  Negative for cough and shortness of breath.   Cardiovascular:  Negative for chest pain.  Gastrointestinal:  Negative for blood in stool.  Genitourinary:  Positive for hematuria.   Skin:  Negative for rash.  Neurological:  Negative for extremity weakness.     Physical Exam:  height is 6\' 2"  (1.88 m) and weight is 217 lb 12.8 oz (98.8 kg). His oral temperature is 98.2 F (36.8 C). His blood pressure is 106/71 and his pulse is 75. His respiration is 18 and oxygen saturation is 100%.   Physical Exam Vitals and nursing note reviewed.  Constitutional:      General: He is not in acute  distress.    Appearance: Normal appearance. He is normal weight. He is not ill-appearing, toxic-appearing or diaphoretic.  Cardiovascular:     Pulses: Normal pulses.     Heart sounds: Normal heart sounds.  Pulmonary:     Effort: Pulmonary effort is normal.     Breath sounds: Normal breath sounds.  Musculoskeletal:        General: No swelling.     Comments: The right arm appears normal without evidence of edema, erythema or tenderness  Skin:    General: Skin is warm.     Capillary Refill: Capillary refill takes less than 2 seconds.     Coloration: Skin is not pale.     Findings: No rash.  Neurological:      Mental Status: He is alert.    Impression and Plan: 1. Basilic vein thrombosis    Joshua Randolph is a very pleasant 52 year old male. He has concerns about continuing the [facility] further and wishes to stop this medication. We had a long and in depth conversation about the risks associated with stopping the Xeralto sooner than 3 months including return/repeat/additional blood clot, MI/stroke. I offered to switch him onto a different anticoagulant to trial that but he declines. Also offered to repeat an [place] to ensure clot has resolved but he declines as his symptoms have fully resolved. Although not advisable I do appreciate him making an autonomous and well informed decision and I will respect that this is his body and his choice. Since he has elected to stop the Xeralto I have advised him to take a fully strength ASA for 3 months, then baby asa for 1-3 months. He was not found to have any major risk factors for additional clots. He has no planned upcoming trips or surgeries. He is not on testosterone. He will follow up with our office as needed.    I spent 25 minutes dedicated to the care of this patient (face to face and non-face to face) on the date of this encounter to include the discussion on xeralto as stated above   Nelwyn Salisbury PA-C 3/21/20249:58 AM

## 2022-06-04 ENCOUNTER — Encounter: Payer: Self-pay | Admitting: Urology

## 2022-06-05 ENCOUNTER — Telehealth: Payer: Self-pay

## 2022-06-05 NOTE — Telephone Encounter (Signed)
Patient returned call. Patient aware of results and verbalized understanding. He stated that he would call back when he had his calendar to schedule his 6 month appt.

## 2022-06-05 NOTE — Telephone Encounter (Signed)
-----   Message from Pamala Hurry, MD sent at 06/05/2022 12:51 PM EDT ----- Regarding: follow up Please let him know that his recent psa and heme profile looked good.  No real concerns at this point. Importantly, I need to see him back in about 77mo for recheck with [facility]. thanks

## 2022-06-05 NOTE — Telephone Encounter (Signed)
Left msg for a return call from patient regarding results.

## 2022-06-08 ENCOUNTER — Ambulatory Visit: Payer: Commercial Managed Care - PPO | Admitting: Psychiatry

## 2022-06-13 ENCOUNTER — Telehealth: Payer: Self-pay | Admitting: *Deleted

## 2022-06-13 NOTE — Telephone Encounter (Signed)
Per 05/31/22 LOS - No follow up needed at this time-Joshua Randolph

## 2022-06-25 ENCOUNTER — Encounter: Payer: Self-pay | Admitting: *Deleted

## 2022-06-25 ENCOUNTER — Telehealth: Payer: Self-pay | Admitting: *Deleted

## 2022-06-25 NOTE — Telephone Encounter (Signed)
Received a fax from [facility] stating Tosymra PA is needed.

## 2022-06-27 DIAGNOSIS — E538 Deficiency of other specified B group vitamins: Secondary | ICD-10-CM | POA: Diagnosis not present

## 2022-06-27 DIAGNOSIS — E039 Hypothyroidism, unspecified: Secondary | ICD-10-CM | POA: Diagnosis not present

## 2022-06-27 DIAGNOSIS — I82409 Acute embolism and thrombosis of unspecified deep veins of unspecified lower extremity: Secondary | ICD-10-CM | POA: Diagnosis not present

## 2022-06-27 DIAGNOSIS — R42 Dizziness and giddiness: Secondary | ICD-10-CM | POA: Diagnosis not present

## 2022-06-27 DIAGNOSIS — E559 Vitamin D deficiency, unspecified: Secondary | ICD-10-CM | POA: Diagnosis not present

## 2022-06-27 DIAGNOSIS — E291 Testicular hypofunction: Secondary | ICD-10-CM | POA: Diagnosis not present

## 2022-07-10 ENCOUNTER — Other Ambulatory Visit (HOSPITAL_COMMUNITY): Payer: Self-pay

## 2022-07-10 ENCOUNTER — Telehealth (HOSPITAL_COMMUNITY): Payer: Self-pay | Admitting: Pharmacy Technician

## 2022-07-10 NOTE — Telephone Encounter (Signed)
Status of PA in separate encounter 

## 2022-07-10 NOTE — Telephone Encounter (Signed)
Patient Advocate Encounter  Received notification that the request for prior authorization for Tosymra 10MG /ACT Nasal Spray has been denied due to This drug/product is not covered under the pharmacy benefit. Prior Authorization is not available.Roland Earl, CPhT Pharmacy Patient Advocate Specialist [facility] Patient Advocate Team Direct Number: [phone]  Fax: [phone]

## 2022-08-10 ENCOUNTER — Other Ambulatory Visit (HOSPITAL_BASED_OUTPATIENT_CLINIC_OR_DEPARTMENT_OTHER): Payer: Self-pay

## 2022-08-10 DIAGNOSIS — E559 Vitamin D deficiency, unspecified: Secondary | ICD-10-CM | POA: Diagnosis not present

## 2022-08-10 DIAGNOSIS — E291 Testicular hypofunction: Secondary | ICD-10-CM | POA: Diagnosis not present

## 2022-08-10 DIAGNOSIS — E538 Deficiency of other specified B group vitamins: Secondary | ICD-10-CM | POA: Diagnosis not present

## 2022-08-10 DIAGNOSIS — E039 Hypothyroidism, unspecified: Secondary | ICD-10-CM | POA: Diagnosis not present

## 2022-08-10 DIAGNOSIS — R42 Dizziness and giddiness: Secondary | ICD-10-CM | POA: Diagnosis not present

## 2022-08-10 MED ORDER — TESTOSTERONE CYPIONATE 200 MG/ML IM SOLN
200.0000 mg | INTRAMUSCULAR | 2 refills | Status: DC
  Filled 2022-08-10: qty 10, 28d supply, fill #0
  Filled 2022-10-25: qty 4, 28d supply, fill #1
  Filled 2022-12-01: qty 10, 70d supply, fill #2

## 2022-08-13 ENCOUNTER — Other Ambulatory Visit (HOSPITAL_BASED_OUTPATIENT_CLINIC_OR_DEPARTMENT_OTHER): Payer: Self-pay

## 2022-08-15 ENCOUNTER — Other Ambulatory Visit (HOSPITAL_BASED_OUTPATIENT_CLINIC_OR_DEPARTMENT_OTHER): Payer: Self-pay

## 2022-09-27 ENCOUNTER — Other Ambulatory Visit (HOSPITAL_BASED_OUTPATIENT_CLINIC_OR_DEPARTMENT_OTHER): Payer: Self-pay

## 2022-09-27 MED ORDER — THYROID 60 MG PO TABS
60.0000 mg | ORAL_TABLET | Freq: Every day | ORAL | 2 refills | Status: AC
  Filled 2022-09-27: qty 90, 90d supply, fill #0
  Filled 2022-12-13 (×2): qty 90, 90d supply, fill #1

## 2022-10-16 ENCOUNTER — Ambulatory Visit (INDEPENDENT_AMBULATORY_CARE_PROVIDER_SITE_OTHER): Payer: Commercial Managed Care - PPO | Admitting: Family Medicine

## 2022-10-16 ENCOUNTER — Encounter: Payer: Self-pay | Admitting: Family Medicine

## 2022-10-16 VITALS — BP 116/82 | HR 63 | Temp 98.0°F | Ht 75.0 in | Wt 225.2 lb

## 2022-10-16 DIAGNOSIS — Z0000 Encounter for general adult medical examination without abnormal findings: Secondary | ICD-10-CM | POA: Diagnosis not present

## 2022-10-16 NOTE — Progress Notes (Signed)
Chief Complaint  Patient presents with   Annual Exam    Well Male Megh Pawlik is here for a complete physical.   His last physical was >1 year ago.  Current diet: diet could be better.  Current exercise: limited Weight trend: stable Fatigue out of ordinary? No. Seat belt? Yes.   Advanced directive? Yes  Health maintenance Shingrix- No Colonoscopy- Due Tetanus- Yes HIV- Yes Hep C- Yes   Past Medical History:  Diagnosis Date   Hyperlipemia    Meniere disease, right 2014      Past Surgical History:  Procedure Laterality Date   ANKLE SURGERY     LAMINECTOMY      Medications  Current Outpatient Medications on File Prior to Visit  Medication Sig Dispense Refill   SUMAtriptan (IMITREX) 50 MG tablet Take 1 tablet (50 mg total) by mouth every 2 (two) hours as needed for migraine. May repeat in 2 hours if headache persists or recurs. 30 tablet 0   testosterone cypionate (DEPOTESTOSTERONE CYPIONATE) 200 MG/ML injection Inject 1 mL (200 mg total) into the muscle once a week. 10 mL 2   thyroid (NP THYROID) 60 MG tablet Take 1 tablet (60 mg total) by mouth daily before breakfast. 90 tablet 2   SUMAtriptan (TOSYMRA) 10 MG/ACT SOLN Place 1 spray into the nose every hour as needed. Max 3 doses a day. (Patient not taking: Reported on 10/16/2022) 6 each 11   SUMAtriptan Succinate (ZEMBRACE SYMTOUCH) 3 MG/0.5ML SOAJ Inject 3 mg into the skin once as needed for up to 1 dose. May repeat in 15 minutes. If symptoms persist, repeat in 2 hours. Max 4 injections daily. (Patient not taking: Reported on 10/16/2022) 1 mL 0    Allergies No Known Allergies  Family History Family History  Problem Relation Age of Onset   Migraines Neg Hx     Review of Systems: Constitutional:  no fevers Eye:  no recent significant change in vision Ear/Nose/Mouth/Throat:  Ears:  no hearing loss Nose/Mouth/Throat:  no complaints of nasal congestion, no sore throat Cardiovascular:  no chest pain Respiratory:   no shortness of breath Gastrointestinal:  no change in bowel habits GU:  Male: negative for dysuria, frequency Musculoskeletal/Extremities:  no joint pain Integumentary (Skin/Breast):  no abnormal skin lesions reported Neurologic:  no headaches Endocrine: No unexpected weight changes Hematologic/Lymphatic:  no abnormal bleeding  Exam BP 116/82 (BP Location: Left Arm, Patient Position: Sitting, Cuff Size: Large)   Pulse 63   Temp 98 F (36.7 C) (Oral)   Ht 6\' 3"  (1.905 m)   Wt 225 lb 4 oz (102.2 kg)   SpO2 97%   BMI 28.15 kg/m  General:  well developed, well nourished, in no apparent distress Skin:  no significant moles, warts, or growths Head:  no masses, lesions, or tenderness Eyes:  pupils equal and round, sclera anicteric without injection Ears:  canals without lesions, TMs shiny without retraction, no obvious effusion, no erythema Nose:  nares patent, mucosa normal Throat/Pharynx:  lips and gingiva without lesion; tongue and uvula midline; non-inflamed pharynx; no exudates or postnasal drainage Neck: neck supple without adenopathy, thyromegaly, or masses Cardiac: RRR, no bruits, no LE edema Lungs:  clear to auscultation, breath sounds equal bilaterally, no respiratory distress Abdomen: BS+, soft, non-tender, non-distended, no masses or organomegaly noted Rectal: Deferred Musculoskeletal:  symmetrical muscle groups noted without atrophy or deformity Neuro:  gait normal; deep tendon reflexes normal and symmetric Psych: well oriented with normal range of affect and appropriate judgment/insight  Assessment and Plan  Well adult exam - Plan: CBC, Comprehensive metabolic panel, Lipid panel   Well 52 y.o. male. Counseled on diet and exercise. Had PSA screening w urology. Advanced directive form provided today.  Shingrix rec'd.  CCS: Declined for now, wants wife to have first and then he will contact us to place referral.  Immunizations, labs, and further orders as  above. Follow up in 1 yr. The patient voiced understanding and agreement to the plan.  Jilda Roche [place], DO 10/16/22 2:02 PM

## 2022-10-16 NOTE — Patient Instructions (Addendum)
Give [place] 2-3 business days to get the results of your labs back.   Keep the diet clean and stay active.  The Shingrix vaccine (for shingles) is a 2 shot series spaced 2-6 months apart. It can make people feel low energy, achy and almost like they have the flu for 48 hours after injection. 1/5 people can have nausea and/or vomiting. Please plan accordingly when deciding on when to get this shot. Call our office for a nurse visit appointment to get this. The second shot of the series is less severe regarding the side effects, but it still lasts 48 hours.   I recommend getting the flu shot in mid October. This suggestion would change if the CDC comes out with a different recommendation.   Send me a message when you are ready to have a colonoscopy.   Let us know if you need anything.

## 2022-10-17 ENCOUNTER — Other Ambulatory Visit: Payer: Self-pay | Admitting: Family Medicine

## 2022-10-17 DIAGNOSIS — R739 Hyperglycemia, unspecified: Secondary | ICD-10-CM

## 2022-10-17 DIAGNOSIS — E785 Hyperlipidemia, unspecified: Secondary | ICD-10-CM

## 2022-10-19 ENCOUNTER — Encounter: Payer: Self-pay | Admitting: Family Medicine

## 2022-10-19 ENCOUNTER — Other Ambulatory Visit: Payer: Self-pay | Admitting: Family Medicine

## 2022-10-26 ENCOUNTER — Other Ambulatory Visit (HOSPITAL_BASED_OUTPATIENT_CLINIC_OR_DEPARTMENT_OTHER): Payer: Self-pay

## 2022-10-26 ENCOUNTER — Other Ambulatory Visit: Payer: Self-pay

## 2022-12-10 ENCOUNTER — Other Ambulatory Visit (HOSPITAL_BASED_OUTPATIENT_CLINIC_OR_DEPARTMENT_OTHER): Payer: Self-pay

## 2022-12-11 ENCOUNTER — Other Ambulatory Visit (HOSPITAL_BASED_OUTPATIENT_CLINIC_OR_DEPARTMENT_OTHER): Payer: Self-pay

## 2022-12-13 ENCOUNTER — Encounter: Payer: Self-pay | Admitting: Pharmacist

## 2022-12-13 ENCOUNTER — Other Ambulatory Visit (HOSPITAL_COMMUNITY): Payer: Self-pay

## 2022-12-13 ENCOUNTER — Other Ambulatory Visit (HOSPITAL_BASED_OUTPATIENT_CLINIC_OR_DEPARTMENT_OTHER): Payer: Self-pay

## 2022-12-13 ENCOUNTER — Other Ambulatory Visit: Payer: Self-pay

## 2022-12-21 DIAGNOSIS — E291 Testicular hypofunction: Secondary | ICD-10-CM | POA: Diagnosis not present

## 2023-01-15 DIAGNOSIS — E291 Testicular hypofunction: Secondary | ICD-10-CM | POA: Diagnosis not present

## 2023-01-15 DIAGNOSIS — E559 Vitamin D deficiency, unspecified: Secondary | ICD-10-CM | POA: Diagnosis not present

## 2023-01-15 DIAGNOSIS — E039 Hypothyroidism, unspecified: Secondary | ICD-10-CM | POA: Diagnosis not present

## 2023-01-15 DIAGNOSIS — E538 Deficiency of other specified B group vitamins: Secondary | ICD-10-CM | POA: Diagnosis not present

## 2023-01-24 LAB — MICROSCOPIC EXAMINATION
Cast Type: NONE SEEN
Casts: NONE SEEN /[LPF]
Crystals: NONE SEEN
Epithelial Cells (non renal): NONE SEEN /[HPF] (ref 0–10)
Mucus, UA: NONE SEEN
RBC, Urine: >30 /[HPF] — AB (ref 0–2)
Renal Epithel, UA: NONE SEEN /[HPF]
Trichomonas, UA: NONE SEEN
Yeast, UA: NONE SEEN

## 2023-01-24 LAB — URINALYSIS, ROUTINE W REFLEX MICROSCOPIC
Bilirubin, UA: NEGATIVE
Glucose, UA: NEGATIVE
Ketones, UA: NEGATIVE
Leukocytes,UA: NEGATIVE
Nitrite, UA: NEGATIVE
Protein,UA: NEGATIVE
Specific Gravity, UA: 1.01 (ref 1.005–1.030)
Urobilinogen, Ur: 0.2 mg/dL (ref 0.2–1.0)
pH, UA: 6 (ref 5.0–7.5)

## 2023-02-28 DIAGNOSIS — Z0100 Encounter for examination of eyes and vision without abnormal findings: Secondary | ICD-10-CM | POA: Diagnosis not present

## 2023-03-23 ENCOUNTER — Other Ambulatory Visit (HOSPITAL_BASED_OUTPATIENT_CLINIC_OR_DEPARTMENT_OTHER): Payer: Self-pay

## 2023-03-25 ENCOUNTER — Other Ambulatory Visit (HOSPITAL_BASED_OUTPATIENT_CLINIC_OR_DEPARTMENT_OTHER): Payer: Self-pay

## 2023-03-25 MED ORDER — TESTOSTERONE CYPIONATE 200 MG/ML IM SOLN
200.0000 mg | INTRAMUSCULAR | 2 refills | Status: AC
Start: 2023-03-23 — End: ?
  Filled 2023-03-25: qty 10, 28d supply, fill #0
  Filled 2023-07-10 – 2023-07-29 (×2): qty 10, 28d supply, fill #1

## 2023-03-26 ENCOUNTER — Other Ambulatory Visit (HOSPITAL_BASED_OUTPATIENT_CLINIC_OR_DEPARTMENT_OTHER): Payer: Self-pay

## 2023-03-27 ENCOUNTER — Other Ambulatory Visit (HOSPITAL_BASED_OUTPATIENT_CLINIC_OR_DEPARTMENT_OTHER): Payer: Self-pay

## 2023-03-28 ENCOUNTER — Encounter (HOSPITAL_BASED_OUTPATIENT_CLINIC_OR_DEPARTMENT_OTHER): Payer: Self-pay

## 2023-03-28 ENCOUNTER — Other Ambulatory Visit (HOSPITAL_BASED_OUTPATIENT_CLINIC_OR_DEPARTMENT_OTHER): Payer: Self-pay

## 2023-03-28 MED ORDER — "BD DISP NEEDLE 23G X 1"" MISC"
1.0000 | 2 refills | Status: AC
  Filled 2023-03-28: qty 10, 70d supply, fill #0
  Filled 2023-11-04: qty 10, 70d supply, fill #1

## 2023-03-28 MED ORDER — "BD LUER-LOK SYRINGE 18G X 1-1/2"" 3 ML MISC"
1.0000 | 2 refills | Status: AC
  Filled 2023-03-28: qty 10, 70d supply, fill #0
  Filled 2023-11-04: qty 10, 70d supply, fill #1

## 2023-05-26 ENCOUNTER — Encounter: Payer: Self-pay | Admitting: Family Medicine

## 2023-05-26 DIAGNOSIS — Z1211 Encounter for screening for malignant neoplasm of colon: Secondary | ICD-10-CM

## 2023-05-27 ENCOUNTER — Other Ambulatory Visit (HOSPITAL_BASED_OUTPATIENT_CLINIC_OR_DEPARTMENT_OTHER): Payer: Self-pay

## 2023-05-27 ENCOUNTER — Other Ambulatory Visit: Payer: Self-pay | Admitting: Family Medicine

## 2023-05-27 MED ORDER — SUMATRIPTAN SUCCINATE 50 MG PO TABS
50.0000 mg | ORAL_TABLET | ORAL | 2 refills | Status: AC | PRN
  Filled 2023-05-27: qty 10, 30d supply, fill #0

## 2023-05-27 NOTE — Telephone Encounter (Signed)
 Referral placed for GI

## 2023-05-31 ENCOUNTER — Encounter: Payer: Self-pay | Admitting: Family Medicine

## 2023-05-31 ENCOUNTER — Telehealth: Admitting: Family Medicine

## 2023-05-31 VITALS — Ht 75.0 in | Wt 225.0 lb

## 2023-05-31 DIAGNOSIS — R0681 Apnea, not elsewhere classified: Secondary | ICD-10-CM

## 2023-05-31 NOTE — Progress Notes (Signed)
 Chief Complaint  Patient presents with   Sleeping Problem    Subjective: Patient is a 53 y.o. male here for possible OSA. We are interacting via web portal for an electronic face-to-face visit. I verified patient's ID using 2 identifiers. Patient agreed to proceed with visit via this method. Patient is at in a parked car, I am at office. Patient and I are present for visit.   Pt's wife noticed he has stopped breathing at night. +famhx of OSA on CPAP in dad. Wakes up feeling tired. He does snore. Has never had a sleep study before. He will sometimes nap in the evening. Does not fall asleep behind wheel or while conversing.   Past Medical History:  Diagnosis Date   Hyperlipemia    Meniere disease, right 2014    Objective: Ht 6\' 3"  (1.905 m)   Wt 225 lb (102.1 kg)   BMI 28.12 kg/m  No conversational dyspnea Age appropriate judgment and insight Nml affect and mood  Assessment and Plan: Witnessed apneic spells - Plan: Ambulatory referral to Neurology  Will refer to [facility] for further eval. Consider elevating HOB. F/u as originally scheduled.  The patient voiced understanding and agreement to the plan.  Jilda Roche [place], DO 05/31/23  1:34 PM

## 2023-06-25 DIAGNOSIS — M62830 Muscle spasm of back: Secondary | ICD-10-CM | POA: Diagnosis not present

## 2023-06-26 ENCOUNTER — Other Ambulatory Visit (HOSPITAL_BASED_OUTPATIENT_CLINIC_OR_DEPARTMENT_OTHER): Payer: Self-pay

## 2023-06-26 MED ORDER — BACLOFEN 10 MG PO TABS
10.0000 mg | ORAL_TABLET | Freq: Every day | ORAL | 1 refills | Status: DC
  Filled 2023-06-26: qty 7, 7d supply, fill #0

## 2023-07-08 ENCOUNTER — Institutional Professional Consult (permissible substitution): Admitting: Neurology

## 2023-07-10 ENCOUNTER — Other Ambulatory Visit (HOSPITAL_BASED_OUTPATIENT_CLINIC_OR_DEPARTMENT_OTHER): Payer: Self-pay

## 2023-07-11 ENCOUNTER — Other Ambulatory Visit (HOSPITAL_BASED_OUTPATIENT_CLINIC_OR_DEPARTMENT_OTHER): Payer: Self-pay

## 2023-07-16 ENCOUNTER — Ambulatory Visit (INDEPENDENT_AMBULATORY_CARE_PROVIDER_SITE_OTHER): Admitting: Neurology

## 2023-07-16 ENCOUNTER — Other Ambulatory Visit (HOSPITAL_BASED_OUTPATIENT_CLINIC_OR_DEPARTMENT_OTHER): Payer: Self-pay

## 2023-07-16 ENCOUNTER — Encounter: Payer: Self-pay | Admitting: Neurology

## 2023-07-16 VITALS — BP 124/70 | HR 65 | Ht 74.0 in | Wt 222.0 lb

## 2023-07-16 DIAGNOSIS — R7989 Other specified abnormal findings of blood chemistry: Secondary | ICD-10-CM | POA: Diagnosis not present

## 2023-07-16 DIAGNOSIS — R0683 Snoring: Secondary | ICD-10-CM

## 2023-07-16 DIAGNOSIS — R351 Nocturia: Secondary | ICD-10-CM

## 2023-07-16 DIAGNOSIS — R0681 Apnea, not elsewhere classified: Secondary | ICD-10-CM | POA: Diagnosis not present

## 2023-07-16 DIAGNOSIS — R635 Abnormal weight gain: Secondary | ICD-10-CM | POA: Diagnosis not present

## 2023-07-16 DIAGNOSIS — Z9189 Other specified personal risk factors, not elsewhere classified: Secondary | ICD-10-CM

## 2023-07-16 DIAGNOSIS — E663 Overweight: Secondary | ICD-10-CM

## 2023-07-16 DIAGNOSIS — Z820 Family history of epilepsy and other diseases of the nervous system: Secondary | ICD-10-CM | POA: Diagnosis not present

## 2023-07-16 NOTE — Patient Instructions (Signed)

## 2023-07-16 NOTE — Progress Notes (Signed)
 Subjective:    Patient ID: Joshua Randolph is a 53 y.o. male.  HPI    Debbra Fairy, MD, PhD [facility] [address], [address] [address] [place], [place] [address]  Dear Dr. Gwenette Lennox,  I saw your patient, Arvell Maltez, upon your kind request in my sleep clinic today for initial consultation of his sleep disorder, in particular, concern for underlying obstructive sleep apnea.  The patient is unaccompanied today.  As you know, Mr. Deboer is a 53 year old male with an underlying medical history of hyperlipidemia, low T, on hormone replacement, vitamin D deficiency, basilic vein thrombosis, vertigo (for which he had seen my colleague, Dr. Tresia Fruit in 2024) hypothyroidism, hyperglycemia, hyperlipidemia, testicular hypofunction, Mnire's disease, and overweight state, who reports snoring and occasional sleep disruption, witnessed apneas per wife's observation. His Epworth sleepiness score is 4 out of 24, fatigue severity score is 20 out of 63.  I reviewed your video visit note from 05/31/2023.  He reports a family history of sleep apnea affecting his father; he had a CPAP machine. His wife is a Publishing rights manager.  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  He lives with his wife and 2 children, ages 53 and 57.  They have 2 dogs and 1 cat in the household, the pets are typically not in the bedroom at night, they do not have a TV in the bedroom.  He goes to bed generally between 9 and 10, rise time is generally between 5:30 AM and 6.  He denies recurrent morning headaches.  He has nocturia about once per average night.  He tried an over-the-counter oral appliance briefly but found it very uncomfortable.  He has gained about 8 to 10 pounds within the past year.  He is a non-smoker and drinks alcohol rarely, caffeine in limitation, 1 cup coffee in the morning.  His Past Medical History Is Significant For: Past Medical History:  Diagnosis Date   Hyperlipemia    Meniere disease, right  2014    His Past Surgical History Is Significant For: Past Surgical History:  Procedure Laterality Date   ANKLE SURGERY     LAMINECTOMY      His Family History Is Significant For: Family History  Problem Relation Age of Onset   Sleep apnea Father    Migraines Neg Hx     His Social History Is Significant For: Social History   Socioeconomic History   Marital status: Married    Spouse name: Not on file   Number of children: Not on file   Years of education: Not on file   Highest education level: Not on file  Occupational History   Not on file  Tobacco Use   Smoking status: Never   Smokeless tobacco: Never  Vaping Use   Vaping status: Never Used  Substance and Sexual Activity   Alcohol use: Yes    Comment: rarely   Drug use: No   Sexual activity: Yes  Other Topics Concern   Not on file  Social History Narrative   Right handed   Caffeine: 1 cup/day   Lives at home with wife and 2 kids   Social Drivers of Health   Financial Resource Strain: Patient Declined (05/03/2022)   Overall Financial Resource Strain (CARDIA)    Difficulty of Paying Living Expenses: Patient declined  Food Insecurity: Patient Declined (05/03/2022)   Hunger Vital Sign    Worried About Running Out of Food in the Last Year: Patient declined    Ran  Out of Food in the Last Year: Patient declined  Transportation Needs: Patient Declined (05/03/2022)   PRAPARE - Transportation    Lack of Transportation (Medical): Patient declined    Lack of Transportation (Non-Medical): Patient declined  Physical Activity: Patient Declined (05/03/2022)   Exercise Vital Sign    Days of Exercise per Week: Patient declined    Minutes of Exercise per Session: Patient declined  Stress: No Stress Concern Present (08/18/2020)   Received from [facility] visits prior to 05/12/2022., [facility] visits prior to 05/12/2022.   [employer] of Occupational Health - Occupational Stress  Questionnaire    Feeling of Stress : Not at all  Social Connections: Patient Declined (05/03/2022)   Social Connection and Isolation Panel [NHANES]    Frequency of Communication with Friends and Family: Patient declined    Frequency of Social Gatherings with Friends and Family: Patient declined    Attends Religious Services: Patient declined    Database administrator or Organizations: Patient declined    Attends Banker Meetings: Patient declined    Marital Status: Patient declined    His Allergies Are:  No Known Allergies:   His Current Medications Are:  Outpatient Encounter Medications as of 07/16/2023  Medication Sig   baclofen  (LIORESAL ) 10 MG tablet Take 1 tablet (10 mg total) by mouth daily.   NEEDLE, DISP, 23 G (BD DISP NEEDLE) 23G X 1" MISC Use 1 needle to inject testosterone  into the muscle once a week.   SUMAtriptan  (IMITREX ) 50 MG tablet Take 1 tablet (50 mg total) by mouth every 2 (two) hours as needed for migraine. May repeat in 2 hours if headache persists or recurs.   SYRINGE-NEEDLE, DISP, 3 ML (B-D 3CC LUER-LOK SYR 18GX1-1/2) 18G X 1-1/2" 3 ML MISC Use 1 syringe to draw up testosterone  to inject once a week.   testosterone  cypionate (DEPOTESTOSTERONE CYPIONATE) 200 MG/ML injection Inject 1 mL (200 mg total) into the muscle once a week.   thyroid  (NP THYROID ) 60 MG tablet Take 1 tablet (60 mg total) by mouth daily before breakfast.   No facility-administered encounter medications on file as of 07/16/2023.  :   Review of Systems:  Out of a complete 14 point review of systems, all are reviewed and negative with the exception of these symptoms as listed below:  Review of Systems  Neurological:        Room 9 Pt is here Alone. Pt states that his wife states he has had a couple of incidents where he has had sleep Apnea. Pt states that he doesn't have to catch his breath when he is sleeping. Pt states that he knows that he snores. ESS 4 FSS 20      Objective:   Neurological Exam  Physical Exam Physical Examination:   Vitals:   07/16/23 0853  BP: 124/70  Pulse: 65    General Examination: The patient is a very pleasant 53 y.o. male in no acute distress. He appears well-developed and well-nourished and well groomed.   HEENT: Normocephalic, atraumatic, pupils are equal, round and reactive to light, extraocular tracking is good without limitation to gaze excursion or nystagmus noted. Hearing is grossly intact. Face is symmetric with normal facial animation. Speech is clear with no dysarthria noted. There is no hypophonia. There is no lip, neck/head, jaw or voice tremor. Neck is supple with full range of passive and active motion. There are no carotid bruits on auscultation. Oropharynx exam  reveals: No significant mouth dryness, good dental hygiene, mild to moderate airway crowding secondary to redundant soft palate, tip of uvula and tonsils not fully visualized, Mallampati class IV.  Minimal overbite noted, neck circumference 17 inches.    Chest: Clear to auscultation without wheezing, rhonchi or crackles noted.  Heart: S1+S2+0, regular and normal without murmurs, rubs or gallops noted.   Abdomen: Soft, non-tender and non-distended.  Extremities: There is no pitting edema in the distal lower extremities bilaterally.   Skin: Warm and dry without trophic changes noted.   Musculoskeletal: exam reveals no obvious joint deformities.   Neurologically:  Mental status: The patient is awake, alert and oriented in all 4 spheres. His immediate and remote memory, attention, language skills and fund of knowledge are appropriate. There is no evidence of aphasia, agnosia, apraxia or anomia. Speech is clear with normal prosody and enunciation. Thought process is linear. Mood is normal and affect is normal.  Cranial nerves II - XII are as described above under HEENT exam.  Motor exam: Normal bulk, moving all 4 extremities without limitation, no obvious action or  resting tremor.  Fine motor skills and coordination: grossly intact.  Cerebellar testing: No dysmetria or intention tremor. There is no truncal or gait ataxia.  Sensory exam: intact to light touch in the upper and lower extremities.  Gait, station and balance: He stands easily. No veering to one side is noted. No leaning to one side is noted. Posture is age-appropriate and stance is narrow based. Gait shows normal stride length and normal pace. No problems turning are noted.   Assessment and Plan:  In summary, Jamontae Seagroves is a very pleasant 53 year old male with an underlying medical history of hyperlipidemia, low T, on hormone replacement, vitamin D deficiency, basilic vein thrombosis, vertigo (for which he had seen my colleague, Dr. Tresia Fruit in 2024) hypothyroidism, hyperglycemia, hyperlipidemia, testicular hypofunction, Mnire's disease, and overweight state, whose history and physical exam are concerning for sleep disordered breathing, particularly obstructive sleep apnea (OSA). A laboratory attended sleep study is typically considered "gold standard" for evaluation of sleep disordered breathing.   I had a long chat with the patient about my findings and the diagnosis of sleep apnea, particularly OSA, its prognosis and treatment options. We talked about medical/conservative treatments, surgical interventions and non-pharmacological approaches for symptom control. I explained, in particular, the risks and ramifications of untreated moderate to severe OSA, especially with respect to developing cardiovascular disease down the road, including congestive heart failure (CHF), difficult to treat hypertension, cardiac arrhythmias (particularly A-fib), neurovascular complications including TIA, stroke and dementia. Even type 2 diabetes has, in part, been linked to untreated OSA. Symptoms of untreated OSA may include (but may not be limited to) daytime sleepiness, nocturia (i.e. frequent nighttime urination),  memory problems, mood irritability and suboptimally controlled or worsening mood disorder such as depression and/or anxiety, lack of energy, lack of motivation, physical discomfort, as well as recurrent headaches, especially morning or nocturnal headaches. We talked about the importance of maintaining a healthy lifestyle and striving for healthy weight. In addition, we talked about the importance of striving for and maintaining good sleep hygiene. I recommended a sleep study at this time. I outlined the differences between a laboratory attended sleep study which is considered more comprehensive and accurate over the option of a home sleep test (HST); the latter may lead to underestimation of sleep disordered breathing in some instances and does not help with diagnosing upper airway resistance syndrome and is not accurate enough  to diagnose primary central sleep apnea typically. I outlined possible surgical and non-surgical treatment options of OSA, including the use of a positive airway pressure (PAP) device (i.e. CPAP, AutoPAP/APAP or BiPAP in certain circumstances), a custom-made dental device (aka oral appliance, which would require a referral to a specialist dentist or orthodontist typically, and is generally speaking not considered for patients with full dentures or edentulous state), upper airway surgical options, such as traditional UPPP (which is not considered a first-line treatment) or the Inspire device (hypoglossal nerve stimulator, which would involve a referral for consultation with an ENT surgeon, after careful selection, following inclusion criteria - also not first-line treatment). I explained the PAP treatment option to the patient in detail, as this is generally considered first-line treatment.  The patient indicated that he would be willing to try PAP therapy, if the need arises. I explained the importance of being compliant with PAP treatment, not only for insurance purposes but primarily to  improve patient's symptoms symptoms, and for the patient's long term health benefit, including to reduce His cardiovascular risks longer-term.    We will pick up our discussion about the next steps and treatment options after testing.  We will keep him posted as to the test results by phone call and/or MyChart messaging where possible.  We will plan to follow-up in sleep clinic accordingly as well.  I answered all his questions today and the patient was in agreement.   I encouraged him to call with any interim questions, concerns, problems or updates or email us  through MyChart.  Generally speaking, sleep test authorizations may take up to 2 weeks, sometimes less, sometimes longer, the patient is encouraged to get in touch with us  if they do not hear back from the sleep lab staff directly within the next 2 weeks.  Thank you very much for allowing me to participate in the care of this nice patient. If I can be of any further assistance to you please do not hesitate to call me at [phone].  Sincerely,   Debbra Fairy, MD, PhD

## 2023-07-17 ENCOUNTER — Other Ambulatory Visit (HOSPITAL_BASED_OUTPATIENT_CLINIC_OR_DEPARTMENT_OTHER): Payer: Self-pay

## 2023-07-22 ENCOUNTER — Other Ambulatory Visit (HOSPITAL_BASED_OUTPATIENT_CLINIC_OR_DEPARTMENT_OTHER): Payer: Self-pay

## 2023-07-22 ENCOUNTER — Telehealth: Payer: Self-pay | Admitting: Neurology

## 2023-07-22 NOTE — Telephone Encounter (Signed)
 NPSG & HST [employer] cone no auth req for either codes.  Sent mychart.

## 2023-07-23 ENCOUNTER — Other Ambulatory Visit (HOSPITAL_BASED_OUTPATIENT_CLINIC_OR_DEPARTMENT_OTHER): Payer: Self-pay

## 2023-07-24 ENCOUNTER — Other Ambulatory Visit (HOSPITAL_BASED_OUTPATIENT_CLINIC_OR_DEPARTMENT_OTHER): Payer: Self-pay

## 2023-07-24 ENCOUNTER — Other Ambulatory Visit: Payer: Self-pay

## 2023-07-25 ENCOUNTER — Other Ambulatory Visit (HOSPITAL_BASED_OUTPATIENT_CLINIC_OR_DEPARTMENT_OTHER): Payer: Self-pay

## 2023-07-26 ENCOUNTER — Other Ambulatory Visit (HOSPITAL_BASED_OUTPATIENT_CLINIC_OR_DEPARTMENT_OTHER): Payer: Self-pay

## 2023-07-29 ENCOUNTER — Other Ambulatory Visit (HOSPITAL_BASED_OUTPATIENT_CLINIC_OR_DEPARTMENT_OTHER): Payer: Self-pay

## 2023-08-26 ENCOUNTER — Other Ambulatory Visit (HOSPITAL_BASED_OUTPATIENT_CLINIC_OR_DEPARTMENT_OTHER): Payer: Self-pay

## 2023-09-02 ENCOUNTER — Encounter

## 2023-09-11 NOTE — Telephone Encounter (Signed)
 Patient called and canceled his SS appt due to he has to go out of town for a funeral.. he stated he will call back to r/s.

## 2023-09-15 ENCOUNTER — Encounter

## 2023-09-16 ENCOUNTER — Encounter

## 2023-10-21 ENCOUNTER — Ambulatory Visit (INDEPENDENT_AMBULATORY_CARE_PROVIDER_SITE_OTHER): Admitting: Family Medicine

## 2023-10-21 ENCOUNTER — Encounter: Payer: Self-pay | Admitting: Family Medicine

## 2023-10-21 VITALS — BP 126/73 | HR 61 | Temp 98.0°F | Resp 16 | Ht 74.0 in | Wt 221.0 lb

## 2023-10-21 DIAGNOSIS — Z125 Encounter for screening for malignant neoplasm of prostate: Secondary | ICD-10-CM | POA: Diagnosis not present

## 2023-10-21 DIAGNOSIS — Z0000 Encounter for general adult medical examination without abnormal findings: Secondary | ICD-10-CM | POA: Diagnosis not present

## 2023-10-21 MED ORDER — BACLOFEN 10 MG PO TABS: 10.0000 mg | ORAL_TABLET | Freq: Every day | ORAL | Status: AC | PRN

## 2023-10-21 NOTE — Patient Instructions (Addendum)
 Give us  2-3 business days to get the results of your labs back.   Keep the diet clean and stay active.  Please get me a copy of your advanced directive form at your convenience.   Please contact the GI team at: [phone]. If you need a referral to Dr. Curtis office, please send me a MyChart message.   The Shingrix vaccine (for shingles) is a 2 shot series spaced 2-6 months apart. It can make people feel low energy, achy and almost like they have the flu for 48 hours after injection. 1/5 people can have nausea and/or vomiting. Please plan accordingly when deciding on when to get this shot. Call our office for a nurse visit appointment to get this. The second shot of the series is less severe regarding the side effects, but it still lasts 48 hours.   Please consider the pneumonia shot. This can be done here or at the pharmacy.   Let us  know if you need anything.

## 2023-10-21 NOTE — Progress Notes (Signed)
 Chief Complaint  Patient presents with   Annual Exam    CPE    Well Male Verdun Rackley is here for a complete physical.   His last physical was >1 year ago.  Current diet: in general, diet is OK.   Current exercise: strength training Weight trend: stable Fatigue out of ordinary? No. Seat belt? Yes.   Advanced directive? Yes  Health maintenance Shingrix- No Colonoscopy- No Tetanus- Yes HIV- Yes Hep C- Yes   Past Medical History:  Diagnosis Date   Hyperlipemia    Meniere disease, right 2014     Past Surgical History:  Procedure Laterality Date   ANKLE SURGERY     LAMINECTOMY      Medications  Current Outpatient Medications on File Prior to Visit  Medication Sig Dispense Refill   baclofen  (LIORESAL ) 10 MG tablet Take 1 tablet (10 mg total) by mouth daily. 7 tablet 1   NEEDLE, DISP, 23 G (BD DISP NEEDLE) 23G X 1 MISC Use 1 needle to inject testosterone  into the muscle once a week. 10 each 2   SUMAtriptan  (IMITREX ) 50 MG tablet Take 1 tablet (50 mg total) by mouth every 2 (two) hours as needed for migraine. May repeat in 2 hours if headache persists or recurs. 10 tablet 2   SYRINGE-NEEDLE, DISP, 3 ML (B-D 3CC LUER-LOK SYR 18GX1-1/2) 18G X 1-1/2 3 ML MISC Use 1 syringe to draw up testosterone  to inject once a week. 10 each 2   testosterone  cypionate (DEPOTESTOSTERONE CYPIONATE) 200 MG/ML injection Inject 1 mL (200 mg total) into the muscle once a week. 10 mL 2   thyroid  (NP THYROID ) 60 MG tablet Take 1 tablet (60 mg total) by mouth daily before breakfast. 90 tablet 2    Allergies No Known Allergies  Family History Family History  Problem Relation Age of Onset   Sleep apnea Father    Migraines Neg Hx     Review of Systems: Constitutional:  no fevers Eye:  no recent significant change in vision Ear/Nose/Mouth/Throat:  Ears:  no hearing loss Nose/Mouth/Throat:  no complaints of nasal congestion, no sore throat Cardiovascular:  no chest pain Respiratory:  no  shortness of breath Gastrointestinal:  no change in bowel habits GU:  Male: negative for dysuria, frequency Musculoskeletal/Extremities:  no joint pain Integumentary (Skin/Breast):  no abnormal skin lesions reported Neurologic:  no headaches Endocrine: No unexpected weight changes Hematologic/Lymphatic:  no abnormal bleeding  Exam BP 126/73 (BP Location: Left Arm, Patient Position: Sitting)   Pulse 61   Temp 98 F (36.7 C) (Oral)   Resp 16   Ht 6' 2 (1.88 m)   Wt 221 lb (100.2 kg)   SpO2 97%   BMI 28.37 kg/m  General:  well developed, well nourished, in no apparent distress Skin:  no significant moles, warts, or growths Head:  no masses, lesions, or tenderness Eyes:  pupils equal and round, sclera anicteric without injection Ears:  canals without lesions, TMs shiny without retraction, no obvious effusion, no erythema Nose:  nares patent, mucosa normal Throat/Pharynx:  lips and gingiva without lesion; tongue and uvula midline; non-inflamed pharynx; no exudates or postnasal drainage Neck: neck supple without adenopathy, thyromegaly, or masses Cardiac: RRR, no bruits, no LE edema Lungs:  clear to auscultation, breath sounds equal bilaterally, no respiratory distress Abdomen: BS+, soft, non-tender, non-distended, no masses or organomegaly noted Rectal: Deferred Musculoskeletal:  symmetrical muscle groups noted without atrophy or deformity Neuro:  gait normal; deep tendon reflexes normal and symmetric  Psych: well oriented with normal range of affect and appropriate judgment/insight  Assessment and Plan  Well adult exam - Plan: CBC, Comprehensive metabolic panel with GFR, Lipid panel, Hepatitis B surface antibody,quantitative  Screening for prostate cancer - Plan: PSA   Well 53 y.o. male. Counseled on diet and exercise. Shingrix rec'd.  Screen Hep B.  PCV20 rec'd.  CCS: LBGI # given. He may decide to see Dr. Rollin, will let us  know if he needs a new referral. Counseled on  risks and benefits of prostate cancer screening with PSA. The patient agrees to undergo testing. Immunizations, labs, and further orders as above. Follow up in 1 yr. The patient voiced understanding and agreement to the plan.  Mabel Mt Medley, DO 10/21/23 3:56 PM

## 2023-10-22 ENCOUNTER — Ambulatory Visit: Payer: Self-pay | Admitting: Family Medicine

## 2023-10-22 ENCOUNTER — Other Ambulatory Visit: Payer: Self-pay

## 2023-10-22 DIAGNOSIS — E7800 Pure hypercholesterolemia, unspecified: Secondary | ICD-10-CM

## 2023-10-22 LAB — COMPREHENSIVE METABOLIC PANEL WITH GFR
ALT: 46 U/L (ref 0–53)
AST: 25 U/L (ref 0–37)
Albumin: 4.4 g/dL (ref 3.5–5.2)
Alkaline Phosphatase: 52 U/L (ref 39–117)
BUN: 23 mg/dL (ref 6–23)
CO2: 25 meq/L (ref 19–32)
Calcium: 8.9 mg/dL (ref 8.4–10.5)
Chloride: 104 meq/L (ref 96–112)
Creatinine, Ser: 1.17 mg/dL (ref 0.40–1.50)
GFR: 72.38 mL/min (ref >60.00)
Glucose, Bld: 87 mg/dL (ref 70–99)
Potassium: 4.2 meq/L (ref 3.5–5.1)
Sodium: 137 meq/L (ref 135–145)
Total Bilirubin: 0.6 mg/dL (ref 0.2–1.2)
Total Protein: 6.7 g/dL (ref 6.0–8.3)

## 2023-10-22 LAB — LIPID PANEL
Cholesterol: 256 mg/dL — ABNORMAL HIGH (ref 0–200)
HDL: 48.2 mg/dL (ref >39.00)
LDL Cholesterol: 157 mg/dL — ABNORMAL HIGH (ref 0–99)
NonHDL: 207.35
Total CHOL/HDL Ratio: 5
Triglycerides: 250 mg/dL — ABNORMAL HIGH (ref 0.0–149.0)
VLDL: 50 mg/dL — ABNORMAL HIGH (ref 0.0–40.0)

## 2023-10-22 LAB — CBC
HCT: 47 % (ref 39.0–52.0)
Hemoglobin: 15.8 g/dL (ref 13.0–17.0)
MCHC: 33.7 g/dL (ref 30.0–36.0)
MCV: 93.9 fl (ref 78.0–100.0)
Platelets: 225 K/uL (ref 150.0–400.0)
RBC: 5 Mil/uL (ref 4.22–5.81)
RDW: 13.3 % (ref 11.5–15.5)
WBC: 6.5 K/uL (ref 4.0–10.5)

## 2023-10-22 LAB — PSA: PSA: 0.86 ng/mL (ref 0.10–4.00)

## 2023-10-22 LAB — HEPATITIS B SURFACE ANTIBODY, QUANTITATIVE: Hep B S AB Quant (Post): <5 m[IU]/mL — ABNORMAL LOW (ref >=10)

## 2023-11-03 ENCOUNTER — Other Ambulatory Visit (HOSPITAL_BASED_OUTPATIENT_CLINIC_OR_DEPARTMENT_OTHER): Payer: Self-pay

## 2023-11-04 ENCOUNTER — Other Ambulatory Visit (HOSPITAL_BASED_OUTPATIENT_CLINIC_OR_DEPARTMENT_OTHER): Payer: Self-pay

## 2023-11-04 MED ORDER — TESTOSTERONE CYPIONATE 200 MG/ML IM SOLN
100.0000 mg | INTRAMUSCULAR | 2 refills | Status: AC
  Filled 2023-11-04: qty 10, 28d supply, fill #0

## 2023-12-23 ENCOUNTER — Other Ambulatory Visit (HOSPITAL_BASED_OUTPATIENT_CLINIC_OR_DEPARTMENT_OTHER): Payer: Self-pay

## 2023-12-23 MED ORDER — FLUZONE 0.5 ML IM SUSY
0.5000 mL | PREFILLED_SYRINGE | Freq: Once | INTRAMUSCULAR | 0 refills | Status: AC
  Filled 2023-12-23: qty 0.5, 1d supply, fill #0
